# Patient Record
Sex: Female | Born: 1935 | Race: White | Hispanic: No | Marital: Married | Smoking: Never smoker
Health system: Southern US, Community
[De-identification: ages and names within clinical notes are randomized; demographics above are authoritative.]

## PROBLEM LIST (undated history)

## (undated) DIAGNOSIS — Z85038 Personal history of other malignant neoplasm of large intestine: Secondary | ICD-10-CM

## (undated) DIAGNOSIS — E079 Disorder of thyroid, unspecified: Secondary | ICD-10-CM

## (undated) DIAGNOSIS — Z1211 Encounter for screening for malignant neoplasm of colon: Secondary | ICD-10-CM

## (undated) DIAGNOSIS — Z803 Family history of malignant neoplasm of breast: Secondary | ICD-10-CM

## (undated) DIAGNOSIS — M1990 Unspecified osteoarthritis, unspecified site: Secondary | ICD-10-CM

## (undated) DIAGNOSIS — Z800 Family history of malignant neoplasm of digestive organs: Secondary | ICD-10-CM

## (undated) HISTORY — DX: Personal history of other malignant neoplasm of large intestine: Z85.038

## (undated) HISTORY — DX: Unspecified osteoarthritis, unspecified site: M19.90

## (undated) HISTORY — DX: Family history of malignant neoplasm of breast: Z80.3

## (undated) HISTORY — DX: Encounter for screening for malignant neoplasm of colon: Z12.11

## (undated) HISTORY — PX: SALPINGOOPHORECTOMY: SHX82

## (undated) HISTORY — DX: Disorder of thyroid, unspecified: E07.9

## (undated) HISTORY — PX: COLONOSCOPY: SHX174

## (undated) HISTORY — DX: Family history of malignant neoplasm of digestive organs: Z80.0

---

## 1978-11-27 HISTORY — PX: FACIAL NERVE SURGERY: SHX630

## 1981-11-27 HISTORY — PX: ABDOMINAL HYSTERECTOMY: SHX81

## 1988-11-27 HISTORY — PX: COLON SURGERY: SHX602

## 2005-03-30 ENCOUNTER — Ambulatory Visit: Payer: Self-pay | Admitting: General Surgery

## 2005-04-20 ENCOUNTER — Ambulatory Visit: Payer: Self-pay | Admitting: General Surgery

## 2006-05-01 ENCOUNTER — Ambulatory Visit: Payer: Self-pay | Admitting: General Surgery

## 2007-05-02 ENCOUNTER — Ambulatory Visit: Payer: Self-pay | Admitting: General Surgery

## 2008-05-05 ENCOUNTER — Ambulatory Visit: Payer: Self-pay | Admitting: General Surgery

## 2008-07-15 ENCOUNTER — Ambulatory Visit: Payer: Self-pay | Admitting: Ophthalmology

## 2008-07-15 ENCOUNTER — Other Ambulatory Visit: Payer: Self-pay

## 2008-07-27 ENCOUNTER — Ambulatory Visit: Payer: Self-pay | Admitting: Ophthalmology

## 2009-02-02 ENCOUNTER — Ambulatory Visit: Payer: Self-pay | Admitting: Ophthalmology

## 2009-02-15 ENCOUNTER — Ambulatory Visit: Payer: Self-pay | Admitting: Ophthalmology

## 2009-05-27 ENCOUNTER — Ambulatory Visit: Payer: Self-pay | Admitting: General Surgery

## 2009-12-24 ENCOUNTER — Ambulatory Visit: Payer: Self-pay | Admitting: Urology

## 2010-02-23 ENCOUNTER — Ambulatory Visit: Payer: Self-pay | Admitting: Internal Medicine

## 2010-05-31 ENCOUNTER — Ambulatory Visit: Payer: Self-pay | Admitting: General Surgery

## 2010-06-17 ENCOUNTER — Ambulatory Visit: Payer: Self-pay | Admitting: General Surgery

## 2011-06-05 ENCOUNTER — Ambulatory Visit: Payer: Self-pay | Admitting: General Surgery

## 2012-06-05 ENCOUNTER — Ambulatory Visit: Payer: Self-pay | Admitting: General Surgery

## 2012-07-28 HISTORY — PX: EYE SURGERY: SHX253

## 2012-08-07 ENCOUNTER — Ambulatory Visit: Payer: Self-pay

## 2012-08-13 ENCOUNTER — Ambulatory Visit: Payer: Self-pay

## 2013-04-07 ENCOUNTER — Encounter: Payer: Self-pay | Admitting: *Deleted

## 2013-04-07 DIAGNOSIS — Z85038 Personal history of other malignant neoplasm of large intestine: Secondary | ICD-10-CM | POA: Insufficient documentation

## 2013-06-06 ENCOUNTER — Ambulatory Visit: Payer: Self-pay | Admitting: General Surgery

## 2013-06-09 ENCOUNTER — Encounter: Payer: Self-pay | Admitting: General Surgery

## 2013-06-18 ENCOUNTER — Ambulatory Visit: Payer: Self-pay | Admitting: General Surgery

## 2013-06-23 ENCOUNTER — Ambulatory Visit: Payer: Self-pay | Admitting: Family Medicine

## 2013-07-01 ENCOUNTER — Ambulatory Visit: Payer: Self-pay | Admitting: General Surgery

## 2013-07-29 ENCOUNTER — Encounter: Payer: Self-pay | Admitting: *Deleted

## 2013-08-21 ENCOUNTER — Ambulatory Visit (INDEPENDENT_AMBULATORY_CARE_PROVIDER_SITE_OTHER): Payer: Medicare Other | Admitting: General Surgery

## 2013-08-21 ENCOUNTER — Encounter: Payer: Self-pay | Admitting: General Surgery

## 2013-08-21 VITALS — BP 124/70 | HR 70 | Resp 16 | Ht 67.0 in | Wt 127.0 lb

## 2013-08-21 DIAGNOSIS — Z803 Family history of malignant neoplasm of breast: Secondary | ICD-10-CM

## 2013-08-21 DIAGNOSIS — Z85038 Personal history of other malignant neoplasm of large intestine: Secondary | ICD-10-CM

## 2013-08-21 DIAGNOSIS — Z800 Family history of malignant neoplasm of digestive organs: Secondary | ICD-10-CM | POA: Insufficient documentation

## 2013-08-21 NOTE — Patient Instructions (Addendum)
One year bilateral screening mammogram and office visit. Continue self breast exams. Call office for any new breast issues or concerns. Last colonoscopy was 47829- next due in 2016

## 2013-08-21 NOTE — Progress Notes (Signed)
Patient ID: Brittany Oneal, female   DOB: [birth year], 79 y.o.   MRN: [id number]  Chief Complaint  Patient presents with  . Follow-up    mammogram    HPI Brittany Oneal is a 79 y.o. female.  Here today for annual breast evaluation. She is also s/p left colon resection for CA in 1990. Last colonoscopy was in 2011-normal. The most recent mammogram was done on 06-06-13.  Patient does perform regular self breast checks and gets regular mammograms done.  No new breast complaints. No GI complaints.  HPI  Past Medical History  Diagnosis Date  . Unspecified essential hypertension   . Arthritis   . Family history of malignant neoplasm of gastrointestinal tract   . Breast screening, unspecified   . Special screening for malignant neoplasms, colon   . Personal history of malignant neoplasm of large intestine   . Cancer 1990    colon  . Family history of malignant neoplasm of breast   . Thyroid disease     Past Surgical History  Procedure Laterality Date  . Colon surgery  1990    resection d/t cancer  . 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 Abdominal hysterectomy  1983  . Salpingoophorectomy    . Colonoscopy  1997,2001,2004,2006, 2011  . Eye surgery  Sept 2013    Family History  Problem Relation Age of Onset  . Cancer Other     FH of breast and colon cancer  . Cancer Brother     colon  . Cancer Sister     breast    Social History History  Substance Use Topics  . Smoking status: Never Smoker   . Smokeless tobacco: Not on file  . Alcohol Use: No    Allergies  Allergen Reactions  . Contrast Media [Iodinated Diagnostic Agents] Other (See Comments)    Reaction not listed  . Sulfa Antibiotics Hives  . Shellfish Allergy Rash    Current Outpatient Prescriptions  Medication Sig Dispense Refill  . amLODipine (NORVASC) 5 MG tablet Take 5 mg by mouth daily.      . calcium carbonate (OS-CAL - DOSED IN MG OF ELEMENTAL CALCIUM) 1250 MG tablet Take 1  tablet by mouth.      . Glucosamine-Chondroitin (GLUCOSAMINE CHONDR COMPLEX) 500-400 MG CAPS Take 2 capsules by mouth daily.      Marland Kitchen levothyroxine (SYNTHROID, LEVOTHROID) 50 MCG tablet Take 50 mcg by mouth daily before breakfast.      . lisinopril (PRINIVIL,ZESTRIL) 40 MG tablet Take 40 mg by mouth daily.      . Omega-3 Fatty Acids (FISH OIL) 1000 MG CAPS Take 2 capsules by mouth daily.      Marland Kitchen triamterene-hydrochlorothiazide (MAXZIDE-25) 37.5-25 MG per tablet Take 0.5 tablets by mouth daily.      . Vitamin E 100 UNITS TABS Take 1 tablet by mouth daily.       No current facility-administered medications for this visit.    Review of Systems Review of Systems  Constitutional: Negative.   Respiratory: Negative.   Cardiovascular: Negative.     Blood pressure 124/70, pulse 70, resp. rate 16, height 5\' 7"  (1.702 m), weight 127 lb (57.607 kg).  Physical Exam Physical Exam  Constitutional: She is oriented to person, place, and time. She appears well-developed and well-nourished.  Eyes: Conjunctivae are normal. No scleral icterus.  Neck: Neck supple.  Cardiovascular: Normal rate and regular rhythm.   Pulses:  Dorsalis pedis pulses are 2+ on the right side, and 2+ on the left side.       Posterior tibial pulses are 2+ on the right side, and 2+ on the left side.  No lower leg edema  Pulmonary/Chest: Effort normal and breath sounds normal. Right breast exhibits no inverted nipple, no mass, no nipple discharge, no skin change and no tenderness. Left breast exhibits no inverted nipple, no mass, no nipple discharge, no skin change and no tenderness.  Abdominal: Soft. Bowel sounds are normal. There is no hepatosplenomegaly. There is no tenderness. No hernia.  Lymphadenopathy:    She has no cervical adenopathy.    She has no axillary adenopathy.  Neurological: She is alert and oriented to person, place, and time.  Skin: Skin is warm and dry.    Data Reviewed Mammogram reviewed and  stable.  Assessment    Stable exam.    Plan    One year bilateral screening mammogram and office visit.       Refoel Palladino G 08/21/2013, 8:45 PM

## 2013-10-03 ENCOUNTER — Emergency Department: Payer: Self-pay | Admitting: Emergency Medicine

## 2013-10-03 LAB — COMPREHENSIVE METABOLIC PANEL
Albumin: 3.9 g/dL (ref 3.4–5.0)
Alkaline Phosphatase: 66 U/L (ref 50–136)
Anion Gap: 8 (ref 7–16)
BUN: 21 mg/dL — ABNORMAL HIGH (ref 7–18)
Co2: 26 mmol/L (ref 21–32)
Creatinine: 0.89 mg/dL (ref 0.60–1.30)
EGFR (African American): >60
EGFR (Non-African Amer.): >60
Glucose: 128 mg/dL — ABNORMAL HIGH (ref 65–99)
Osmolality: 262 (ref 275–301)
SGOT(AST): 22 U/L (ref 15–37)
SGPT (ALT): 18 U/L (ref 12–78)
Sodium: 128 mmol/L — ABNORMAL LOW (ref 136–145)

## 2013-10-03 LAB — CBC
MCHC: 36.1 g/dL — ABNORMAL HIGH (ref 32.0–36.0)
Platelet: 233 10*3/uL (ref 150–440)
RBC: 4.22 10*6/uL (ref 3.80–5.20)
WBC: 7.6 10*3/uL (ref 3.6–11.0)

## 2013-10-03 LAB — URINALYSIS, COMPLETE
Bacteria: NONE SEEN
Bilirubin,UR: NEGATIVE
Glucose,UR: NEGATIVE mg/dL (ref 0–75)
Hyaline Cast: 9
Leukocyte Esterase: NEGATIVE
Ph: 8 (ref 4.5–8.0)
Specific Gravity: 1.014 (ref 1.003–1.030)
WBC UR: <1 /HPF (ref 0–5)

## 2014-02-03 ENCOUNTER — Ambulatory Visit: Payer: Self-pay | Admitting: Family Medicine

## 2014-08-18 ENCOUNTER — Encounter: Payer: Self-pay | Admitting: General Surgery

## 2014-08-18 ENCOUNTER — Ambulatory Visit (INDEPENDENT_AMBULATORY_CARE_PROVIDER_SITE_OTHER): Payer: Medicare Other | Admitting: General Surgery

## 2014-08-18 VITALS — BP 150/74 | HR 74 | Resp 12 | Ht 66.0 in | Wt 124.0 lb

## 2014-08-18 DIAGNOSIS — Z803 Family history of malignant neoplasm of breast: Secondary | ICD-10-CM

## 2014-08-18 DIAGNOSIS — Z85038 Personal history of other malignant neoplasm of large intestine: Secondary | ICD-10-CM

## 2014-08-18 DIAGNOSIS — Z800 Family history of malignant neoplasm of digestive organs: Secondary | ICD-10-CM

## 2014-08-18 NOTE — Patient Instructions (Signed)
Follow up in one year with bilateral screening mammogram and office visit. Continue self breast exams. Call office for any new breast issues or concerns. 

## 2014-08-18 NOTE — Progress Notes (Signed)
Patient ID: Brittany Oneal, female   DOB: 19-Aug-1931, 80 y.o.   MRN: [id number]  Chief Complaint  Patient presents with  . Follow-up    mammogram    HPI Brittany Oneal is a 80 y.o. female who presents for a breast evaluation. She had sigmoid resection for colon cancer in 1990.The most recent mammogram was done on 08/14/14.    Patient does perform regular self breast checks and gets regular mammograms done.  No new breast issues.   HPI  Past Medical History  Diagnosis Date  . Unspecified essential hypertension   . Arthritis   . Family history of malignant neoplasm of gastrointestinal tract   . Breast screening, unspecified   . Special screening for malignant neoplasms, colon   . Personal history of malignant neoplasm of large intestine   . Cancer 1990    colon  . Family history of malignant neoplasm of breast   . Thyroid disease     Past Surgical History  Procedure Laterality Date  . Colon surgery  1990    resection d/t cancer  . 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 Abdominal hysterectomy  1983  . Salpingoophorectomy    . Colonoscopy  1997,2001,2004,2006, 2011  . Eye surgery  Sept 2013    Family History  Problem Relation Age of Onset  . Cancer Other     FH of breast and colon cancer  . Cancer Brother     colon  . Cancer Sister     breast    Social History History  Substance Use Topics  . Smoking status: Never Smoker   . Smokeless tobacco: Not on file  . Alcohol Use: No    Allergies  Allergen Reactions  . Contrast Media [Iodinated Diagnostic Agents] Other (See Comments)    Reaction not listed  . Sulfa Antibiotics Hives  . Shellfish Allergy Rash    Current Outpatient Prescriptions  Medication Sig Dispense Refill  . amLODipine (NORVASC) 5 MG tablet Take 5 mg by mouth daily.      . calcium carbonate (OS-CAL - DOSED IN MG OF ELEMENTAL CALCIUM) 1250 MG tablet Take 1 tablet by mouth.      . Glucosamine-Chondroitin (GLUCOSAMINE  CHONDR COMPLEX) 500-400 MG CAPS Take 2 capsules by mouth daily.      Marland Kitchen levothyroxine (SYNTHROID, LEVOTHROID) 50 MCG tablet Take 50 mcg by mouth daily before breakfast.      . lisinopril (PRINIVIL,ZESTRIL) 40 MG tablet Take 40 mg by mouth daily.      . Omega-3 Fatty Acids (FISH OIL) 1000 MG CAPS Take 2 capsules by mouth daily.      . Vitamin E 100 UNITS TABS Take 1 tablet by mouth daily.       No current facility-administered medications for this visit.    Review of Systems Review of Systems  Constitutional: Negative.   Respiratory: Negative.   Cardiovascular: Negative.     Blood pressure 150/74, pulse 74, resp. rate 12, height 5\' 6"  (1.676 m), weight 124 lb (56.246 kg).  Physical Exam Physical Exam  Constitutional: She is oriented to person, place, and time. She appears well-developed and well-nourished.  Eyes: Conjunctivae are normal. No scleral icterus.  Neck: Neck supple.  Cardiovascular: Normal rate, regular rhythm and normal heart sounds.   Pulses:      Dorsalis pedis pulses are 2+ on the right side, and 2+ on the left side.       Posterior tibial pulses  are 2+ on the right side, and 2+ on the left side.  Mild bilateral ankle edema. No varicose veins.  Pulmonary/Chest: Effort normal and breath sounds normal. Right breast exhibits no inverted nipple, no mass, no nipple discharge, no skin change and no tenderness. Left breast exhibits no inverted nipple, no mass, no nipple discharge, no skin change and no tenderness.  Abdominal: Soft. Normal appearance and bowel sounds are normal. There is no hepatosplenomegaly. No hernia.  Lymphadenopathy:    She has no cervical adenopathy.    She has no axillary adenopathy.  Neurological: She is alert and oriented to person, place, and time.  Skin: Skin is warm and dry.    Data Reviewed Mammogram reviewed and stable.  Assessment    Stable physical exam.    Plan    Follow up in one year with bilateral screening mammogram and office  visit. Colonoscopy due in 2018       [facility] G 08/18/2014, 9:26 PM

## 2014-08-25 ENCOUNTER — Ambulatory Visit: Payer: Medicare Other | Admitting: General Surgery

## 2014-09-28 ENCOUNTER — Encounter: Payer: Self-pay | Admitting: General Surgery

## 2014-12-21 DIAGNOSIS — C801 Malignant (primary) neoplasm, unspecified: Secondary | ICD-10-CM | POA: Insufficient documentation

## 2015-03-09 ENCOUNTER — Emergency Department
Admit: 2015-03-09 | Disposition: A | Discharge status: Home / Self Care | Payer: Self-pay | Admitting: Emergency Medicine

## 2015-03-09 LAB — URINALYSIS, COMPLETE
Bilirubin,UR: NEGATIVE
Blood: NEGATIVE
Glucose,UR: NEGATIVE mg/dL (ref 0–75)
KETONE: NEGATIVE
Leukocyte Esterase: NEGATIVE
Nitrite: NEGATIVE
Ph: 8 (ref 4.5–8.0)
Protein: NEGATIVE
Specific Gravity: 1.005 (ref 1.003–1.030)

## 2015-03-09 LAB — CBC
HCT: 37.8 % (ref 35.0–47.0)
HGB: 12.6 g/dL (ref 12.0–16.0)
MCH: 28.7 pg (ref 26.0–34.0)
MCHC: 33.3 g/dL (ref 32.0–36.0)
MCV: 86 fL (ref 80–100)
PLATELETS: 190 10*3/uL (ref 150–440)
RBC: 4.39 10*6/uL (ref 3.80–5.20)
RDW: 13.6 % (ref 11.5–14.5)
WBC: 5.8 10*3/uL (ref 3.6–11.0)

## 2015-03-09 LAB — COMPREHENSIVE METABOLIC PANEL
ALBUMIN: 4.4 g/dL
ALT: 17 U/L
AST: 24 U/L
Alkaline Phosphatase: 54 U/L
Anion Gap: 10 (ref 7–16)
BUN: 30 mg/dL — AB
Bilirubin,Total: 0.5 mg/dL
CALCIUM: 9.1 mg/dL
Chloride: 96 mmol/L — ABNORMAL LOW
Co2: 26 mmol/L
Creatinine: 0.75 mg/dL
EGFR (African American): >60
EGFR (Non-African Amer.): >60
GLUCOSE: 121 mg/dL — AB
Potassium: 4.5 mmol/L
SODIUM: 132 mmol/L — AB
Total Protein: 6.7 g/dL

## 2015-03-09 LAB — TROPONIN I

## 2015-05-06 ENCOUNTER — Telehealth: Payer: Self-pay | Admitting: Family Medicine

## 2015-05-06 NOTE — Telephone Encounter (Signed)
Advised to move appt to sooner date as we have not addressed this spot on head.[facility]

## 2015-05-06 NOTE — Telephone Encounter (Signed)
Pt has appt on 6/17.  Should she come in sooner because the spot on the back of her neck/head is sore. Please call pt back at [phone]

## 2015-05-07 ENCOUNTER — Ambulatory Visit (INDEPENDENT_AMBULATORY_CARE_PROVIDER_SITE_OTHER): Payer: Medicare Other | Admitting: Family Medicine

## 2015-05-07 ENCOUNTER — Encounter: Payer: Self-pay | Admitting: Family Medicine

## 2015-05-07 VITALS — BP 160/80 | HR 71 | Temp 97.6°F | Wt 122.4 lb

## 2015-05-07 DIAGNOSIS — I10 Essential (primary) hypertension: Secondary | ICD-10-CM

## 2015-05-07 DIAGNOSIS — L723 Sebaceous cyst: Secondary | ICD-10-CM

## 2015-05-07 DIAGNOSIS — E039 Hypothyroidism, unspecified: Secondary | ICD-10-CM | POA: Diagnosis not present

## 2015-05-07 DIAGNOSIS — L089 Local infection of the skin and subcutaneous tissue, unspecified: Secondary | ICD-10-CM

## 2015-05-07 MED ORDER — CEPHALEXIN 500 MG PO CAPS: 500.0000 mg | ORAL_CAPSULE | Freq: Three times a day (TID) | ORAL | Status: AC

## 2015-05-07 NOTE — Patient Instructions (Signed)
Appt. With Dr. Jamal Collin already made for [facility]. (05/10/15) at 3:00 PM.

## 2015-05-07 NOTE — Progress Notes (Signed)
Name: Brittany Oneal   MRN: [id number]    DOB: 1931/05/30   Date:05/07/2015       Progress Note  Subjective  Chief Complaint  Chief Complaint  Patient presents with  . Hypertension follow up  . Knot on back of head    cyst    HPI   Here for f/u of HBP.   BPs at home usually in 120 sys. Range.  Have been as low as 97 sys. Has a "knot" on back of her head that she wants looked at today.  Has been there for years, but getting bigger and sorer recently.  Has seen Derm at [facility] in [place].  Past Medical History  Diagnosis Date  . Unspecified essential hypertension   . Arthritis   . Family history of malignant neoplasm of gastrointestinal tract   . Breast screening, unspecified   . Special screening for malignant neoplasms, colon   . Personal history of malignant neoplasm of large intestine   . Cancer 1990    colon  . Family history of malignant neoplasm of breast   . Thyroid disease     Past Surgical History  Procedure Laterality Date  . Colon surgery  1990    resection d/t cancer  . 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 Abdominal hysterectomy  1983  . Salpingoophorectomy    . Colonoscopy  1997,2001,2004,2006, 2011  . Eye surgery  Sept 2013    Family History  Problem Relation Age of Onset  . Cancer Other     FH of breast and colon cancer  . Cancer Brother     colon  . Cancer Sister     breast    History   Social History  . Marital Status: Married    Spouse Name: N/A  . Number of Children: N/A  . Years of Education: N/A   Occupational History  . Not on file.   Social History Main Topics  . Smoking status: Never Smoker   . Smokeless tobacco: Not on file  . Alcohol Use: No  . Drug Use: No  . Sexual Activity: Not on file   Other Topics Concern  . Not on file   Social History Narrative     Current outpatient prescriptions:  .  amLODipine (NORVASC) 5 MG tablet, Take 5 mg by mouth daily., Disp: , Rfl:  .  calcium carbonate  (OS-CAL - DOSED IN MG OF ELEMENTAL CALCIUM) 1250 MG tablet, Take 1 tablet by mouth., Disp: , Rfl:  .  Glucosamine-Chondroitin (GLUCOSAMINE CHONDR COMPLEX) 500-400 MG CAPS, Take 2 capsules by mouth daily., Disp: , Rfl:  .  levothyroxine (SYNTHROID, LEVOTHROID) 50 MCG tablet, Take 50 mcg by mouth daily before breakfast., Disp: , Rfl:  .  lisinopril (PRINIVIL,ZESTRIL) 40 MG tablet, Take 40 mg by mouth daily., Disp: , Rfl:  .  Omega-3 Fatty Acids (FISH OIL) 1000 MG CAPS, Take 2 capsules by mouth daily., Disp: , Rfl:  .  Vitamin E 100 UNITS TABS, Take 1 tablet by mouth daily., Disp: , Rfl:  .  cephALEXin (KEFLEX) 500 MG capsule, Take 1 capsule (500 mg total) by mouth 3 (three) times daily., Disp: 21 capsule, Rfl: 0  Allergies  Allergen Reactions  . Contrast Media [Iodinated Diagnostic Agents] Other (See Comments)    Reaction not listed  . Sulfa Antibiotics Hives  . Shellfish Allergy Rash     Review of Systems  Constitutional: Negative.  Negative for fever,  chills and malaise/fatigue.  HENT: Negative.   Eyes: Negative.  Negative for blurred vision.  Respiratory: Negative.  Negative for cough, shortness of breath and wheezing.   Cardiovascular: Negative.  Negative for chest pain, palpitations, orthopnea and leg swelling.  Gastrointestinal: Negative.  Negative for heartburn, nausea, vomiting, abdominal pain, diarrhea and constipation.  Skin:       Mass on back of scalp  Neurological: Negative.  Negative for dizziness, tingling, sensory change, focal weakness, seizures, loss of consciousness, weakness and headaches.     Objective  Filed Vitals:   05/07/15 1101 05/07/15 1132  BP: 173/83 160/80  Pulse: 71   Temp: 97.6 F (36.4 C)   TempSrc: Oral   Weight: 122 lb 6.4 oz (55.52 kg)     Physical Exam  Constitutional: She is well-developed, well-nourished, and in no distress. No distress.  HENT:  Head: Normocephalic and atraumatic.  Neck: Normal range of motion. Neck supple. No  thyromegaly present.  Cardiovascular: Normal rate, regular rhythm, normal heart sounds and intact distal pulses.  Exam reveals no gallop and no friction rub.   No murmur heard. Pulmonary/Chest: Effort normal and breath sounds normal. No respiratory distress. She has no wheezes. She has no rales. She exhibits no tenderness.  Musculoskeletal: She exhibits edema (trace edema of ankles billaterally).  Lymphadenopathy:    She has no cervical adenopathy.  Skin:  2 cm raised cyst of post. Scalp.  It is sl. Red, warm, and tender to palp.  Vitals reviewed.      Recent Results (from the past 2160 hour(s))  Troponin I     Status: None   Collection Time: 03/09/15  4:12 PM  Result Value Ref Range   Troponin-I <0.03 ng/mL    Comment: 0.00-0.03 0.03 ng/mL or less: NEGATIVE  Repeat testing in 3-6 hrs  if clinically indicated. >0.05 ng/mL: POTENTIAL  MYOCARDIAL INJURY. Repeat  testing in 3-6 hrs if  clinically indicated. NOTE: An increase or decrease  of 30% or more on serial  testing suggests a  clinically important change NOTE: New Reference Range  02/02/15   CBC     Status: None   Collection Time: 03/09/15  4:12 PM  Result Value Ref Range   WBC 5.8 3.6-11.0 x10 3/mm 3   RBC 4.39 3.80-5.20 X10 6/mm 3   HGB 12.6 12.0-16.0 g/dL   HCT 37.8 35.0-47.0 %   MCV 86 80-100 fL   MCH 28.7 26.0-34.0 pg   MCHC 33.3 32.0-36.0 g/dL   RDW 13.6 11.5-14.5 %   Platelet 190 150-440 x10 3/mm 3  Comprehensive metabolic panel     Status: Abnormal   Collection Time: 03/09/15  4:12 PM  Result Value Ref Range   Glucose, CSF DNP mg/dL    Comment: 65-99 NOTE: New Reference Range  02/02/15    BUN 30 (H) mg/dL    Comment: 6-20 NOTE: New Reference Range  02/02/15    Creatinine 0.75 mg/dL    Comment: 0.44-1.00 NOTE: New Reference Range  02/02/15    Sodium, Urine Random DNP mmol/L    Comment: 135-145 NOTE: New Reference Range  02/02/15    Potassium, Urine Random DNP mmol/L    Comment:  3.5-5.1 NOTE: New Reference Range  02/02/15    Chloride, Urine Random DNP mmol/L    Comment: 101-111 NOTE: New Reference Range  02/02/15    Co2 26 mmol/L    Comment: 22-32 NOTE: New Reference Range  02/02/15    Calcium, Total 9.1 mg/dL  Comment: 8.9-10.3 NOTE: New Reference Range  02/02/15    SGOT(AST) 24 U/L    Comment: 15-41 NOTE: New Reference Range  02/02/15    SGPT (ALT) 17 U/L    Comment: 14-54 NOTE: New Reference Range  02/02/15    Alkaline Phosphatase 54 U/L    Comment: 38-126 NOTE: New Reference Range  02/02/15    Albumin 4.4 g/dL    Comment: 3.5-5.0 NOTE: New reference range  02/02/15    Total Protein 6.7 g/dL    Comment: 6.5-8.1 NOTE: New Reference Range  02/02/15    Bilirubin,Total 0.5 mg/dL    Comment: 0.3-1.2 NOTE: New Reference Range  02/02/15    Anion Gap 10 7-16   EGFR (African American) >60    EGFR (Non-African Amer.) >60     Comment: eGFR values <84m/min/1.73 m2 may be an indication of chronic kidney disease (CKD). Calculated eGFR is useful in patients with stable renal function. The eGFR calculation will not be reliable in acutely ill patients when serum creatinine is changing rapidly. It is not useful in patients on dialysis. The eGFR calculation may not be applicable to patients at the low and high extremes of body sizes, pregnant women, and vegetarians.    Glucose 121 (H) mg/dL    Comment: 65-99 NOTE: New Reference Range  02/02/15    Sodium 132 (L) mmol/L    Comment: 135-145 NOTE: New Reference Range  02/02/15    Potassium 4.5 mmol/L    Comment: 3.5-5.1 NOTE: New Reference Range  02/02/15    Chloride 96 (L) mmol/L    Comment: 101-111 NOTE: New Reference Range  02/02/15   Urinalysis, Complete     Status: None   Collection Time: 03/09/15  5:11 PM  Result Value Ref Range   Color - urine YELLOW    Clarity - urine CLEAR    Glucose,UR NEGATIVE 0-75 mg/dL   Bilirubin,UR NEGATIVE NEGATIVE   Ketone NEGATIVE  NEGATIVE   Specific Gravity 1.005 1.003-1.030   Blood NEGATIVE NEGATIVE   Ph 8.0 4.5-8.0   Protein NEGATIVE NEGATIVE   Nitrite NEGATIVE NEGATIVE   Leukocyte Esterase NEGATIVE NEGATIVE   RBC,UR 0-5 0-5 /HPF   WBC UR 0-5 0-5 /HPF   Bacteria RARE NONE SEEN   Squamous Epithelial 0-5      Assessment & Plan  Problem List Items Addressed This Visit      Cardiovascular and Mediastinum   Hypertension - Primary (Chronic)   Relevant Orders   Ambulatory referral to Cardiology     Endocrine   Hypothyroidism (Chronic)     Musculoskeletal and Integument   Infected sebaceous cyst   Relevant Medications   cephALEXin (KEFLEX) 500 MG capsule   Other Relevant Orders   Ambulatory referral to General Surgery     1. Essential hypertension  - Ambulatory referral to Cardiology  2. Hypothyroidism, unspecified hypothyroidism type   3. Infected sebaceous cyst  - Ambulatory referral to General Surgery - cephALEXin (KEFLEX) 500 MG capsule; Take 1 capsule (500 mg total) by mouth 3 (three) times daily.  Dispense: 21 capsule; Refill: 0

## 2015-05-07 NOTE — Telephone Encounter (Signed)
Patient seeing Dr. Luan Pulling this morning. Nothing further needed.

## 2015-05-10 ENCOUNTER — Ambulatory Visit (INDEPENDENT_AMBULATORY_CARE_PROVIDER_SITE_OTHER): Payer: Medicare Other | Admitting: General Surgery

## 2015-05-10 ENCOUNTER — Encounter: Payer: Self-pay | Admitting: General Surgery

## 2015-05-10 VITALS — BP 138/72 | HR 78 | Resp 14 | Ht 66.0 in | Wt 123.0 lb

## 2015-05-10 DIAGNOSIS — L729 Follicular cyst of the skin and subcutaneous tissue, unspecified: Secondary | ICD-10-CM

## 2015-05-10 NOTE — Progress Notes (Signed)
Patient ID: Brittany Oneal, female   DOB: [birth year], 81 y.o.   MRN: [id number]  Chief Complaint  Patient presents with  . Other    scalp cyst    HPI Brittany Oneal is a 81 y.o. female for evaluation of a scalp cyst. She was seen for this in January 2015 by dermatology and at that time it was quite small. It has increased in size and become soar to touch in the last week. She denies any drainage from the area. She has been started on 7 day course of Keflex for this.  HPI  Past Medical History  Diagnosis Date  . Unspecified essential hypertension   . Arthritis   . Family history of malignant neoplasm of gastrointestinal tract   . Breast screening, unspecified   . Special screening for malignant neoplasms, colon   . Personal history of malignant neoplasm of large intestine   . Cancer 1990    colon  . Family history of malignant neoplasm of breast   . Thyroid disease     Past Surgical History  Procedure Laterality Date  . Colon surgery  1990    resection d/t cancer  . 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 Abdominal hysterectomy  1983  . Salpingoophorectomy    . Colonoscopy  1997,2001,2004,2006, 2011  . Eye surgery  Sept 2013    Family History  Problem Relation Age of Onset  . Cancer Other     FH of breast and colon cancer  . Cancer Brother     colon  . Cancer Sister     breast    Social History History  Substance Use Topics  . Smoking status: Never Smoker   . Smokeless tobacco: Never Used  . Alcohol Use: No    Allergies  Allergen Reactions  . Contrast Media [Iodinated Diagnostic Agents] Other (See Comments)    Reaction not listed  . Sulfa Antibiotics Hives  . Shellfish Allergy Rash    Current Outpatient Prescriptions  Medication Sig Dispense Refill  . amLODipine (NORVASC) 5 MG tablet Take 5 mg by mouth daily.    . calcium carbonate (OS-CAL - DOSED IN MG OF ELEMENTAL CALCIUM) 1250 MG tablet Take 1 tablet by mouth.    . cephALEXin (KEFLEX)  500 MG capsule Take 1 capsule (500 mg total) by mouth 3 (three) times daily. 21 capsule 0  . Glucosamine-Chondroitin (GLUCOSAMINE CHONDR COMPLEX) 500-400 MG CAPS Take 2 capsules by mouth daily.    Marland Kitchen levothyroxine (SYNTHROID, LEVOTHROID) 50 MCG tablet Take 50 mcg by mouth daily before breakfast.    . lisinopril (PRINIVIL,ZESTRIL) 40 MG tablet Take 40 mg by mouth daily.    . Omega-3 Fatty Acids (FISH OIL) 1000 MG CAPS Take 2 capsules by mouth daily.    . Vitamin E 100 UNITS TABS Take 1 tablet by mouth daily.    . fluticasone (FLONASE) 50 MCG/ACT nasal spray Place 1 spray into the nose as needed.     No current facility-administered medications for this visit.    Review of Systems Review of Systems  Constitutional: Negative.   Respiratory: Negative.   Cardiovascular: Negative.     Blood pressure 138/72, pulse 78, resp. rate 14, height 5\' 6"  (1.676 m), weight 123 lb (55.792 kg).  Physical Exam Physical Exam  Constitutional: She is oriented to person, place, and time. She appears well-developed and well-nourished.  HENT:  1.5 cm cyst to back of head.   Neurological: She  is alert and oriented to person, place, and time.  Skin: Skin is warm and dry.       Data Reviewed   Assessment    Scalp cyst.  Mildly inflammed    Plan    Finish antibiotic-Keflex and return to the office next week for excision.      PCP: Dr Michel Bickers, Caryl-Lyn M 05/10/2015, 3:09 PM

## 2015-05-10 NOTE — Patient Instructions (Signed)
Follow up next week for excision of cyst. Continue antibiotic.

## 2015-05-11 ENCOUNTER — Telehealth: Payer: Self-pay

## 2015-05-11 NOTE — Telephone Encounter (Signed)
Pt has been schedule appt for Cardiology Dr.Arida at [facility] on July 29,2016 @2 :40pm. Please call patient and make her aware.

## 2015-05-11 NOTE — Telephone Encounter (Signed)
This encounter was created in error - please disregard.

## 2015-05-14 ENCOUNTER — Ambulatory Visit: Payer: Self-pay | Admitting: Family Medicine

## 2015-05-18 ENCOUNTER — Encounter: Payer: Self-pay | Admitting: General Surgery

## 2015-05-18 ENCOUNTER — Ambulatory Visit (INDEPENDENT_AMBULATORY_CARE_PROVIDER_SITE_OTHER): Payer: Medicare Other | Admitting: General Surgery

## 2015-05-18 VITALS — BP 124/76 | HR 70 | Resp 12 | Ht 66.0 in | Wt 123.0 lb

## 2015-05-18 DIAGNOSIS — L089 Local infection of the skin and subcutaneous tissue, unspecified: Secondary | ICD-10-CM

## 2015-05-18 DIAGNOSIS — L729 Follicular cyst of the skin and subcutaneous tissue, unspecified: Secondary | ICD-10-CM | POA: Diagnosis not present

## 2015-05-18 NOTE — Patient Instructions (Signed)
Patient to return in four weeks.  Patient advised to call for any recurrent swelling or increased pain.

## 2015-05-18 NOTE — Progress Notes (Signed)
Patient ID: Brittany Oneal, female   DOB: 04-17-1931, 81 y.o.   MRN: [id number]  Chief Complaint  Patient presents with  . Procedure    excision scalp lesion    HPI Brittany Oneal is a 81 y.o. female here today for a scalp excision. Patient came in today with a lot of drainage. The cyst started draining yesterday.  HPI  Past Medical History  Diagnosis Date  . Unspecified essential hypertension   . Arthritis   . Family history of malignant neoplasm of gastrointestinal tract   . Breast screening, unspecified   . Special screening for malignant neoplasms, colon   . Personal history of malignant neoplasm of large intestine   . Cancer 1990    colon  . Family history of malignant neoplasm of breast   . Thyroid disease     Past Surgical History  Procedure Laterality Date  . Colon surgery  1990    resection d/t cancer  . 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 Abdominal hysterectomy  1983  . Salpingoophorectomy    . Colonoscopy  1997,2001,2004,2006, 2011  . Eye surgery  Sept 2013    Family History  Problem Relation Age of Onset  . Cancer Other     FH of breast and colon cancer  . Cancer Brother     colon  . Cancer Sister     breast    Social History History  Substance Use Topics  . Smoking status: Never Smoker   . Smokeless tobacco: Never Used  . Alcohol Use: No    Allergies  Allergen Reactions  . Contrast Media [Iodinated Diagnostic Agents] Other (See Comments)    Reaction not listed  . Sulfa Antibiotics Hives  . Shellfish Allergy Rash    Current Outpatient Prescriptions  Medication Sig Dispense Refill  . amLODipine (NORVASC) 5 MG tablet Take 5 mg by mouth daily.    . calcium carbonate (OS-CAL - DOSED IN MG OF ELEMENTAL CALCIUM) 1250 MG tablet Take 1 tablet by mouth.    . fluticasone (FLONASE) 50 MCG/ACT nasal spray Place 1 spray into the nose as needed.    . Glucosamine-Chondroitin (GLUCOSAMINE CHONDR COMPLEX) 500-400 MG CAPS Take 2 capsules  by mouth daily.    Marland Kitchen levothyroxine (SYNTHROID, LEVOTHROID) 50 MCG tablet Take 50 mcg by mouth daily before breakfast.    . lisinopril (PRINIVIL,ZESTRIL) 40 MG tablet Take 40 mg by mouth daily.    . Omega-3 Fatty Acids (FISH OIL) 1000 MG CAPS Take 2 capsules by mouth daily.    . Vitamin E 100 UNITS TABS Take 1 tablet by mouth daily.     No current facility-administered medications for this visit.    Review of Systems Review of Systems  Blood pressure 124/76, pulse 70, resp. rate 12, height 5\' 6"  (1.676 m), weight 123 lb (55.792 kg).  Physical Exam Physical Exam The cyst on the left occipital area is draining some pus. With pressure, 1-2 mL of thick, yellow pus was removed. Given the infection, excision was not performed today.   Data Reviewed   Assessment    Infected cyst of the scalp.      Plan    Patient to return in four weeks.  Patient advised to call for any recurrent swelling or increased pain.  PCP: Curly Rim G 05/18/2015, 1:46 PM

## 2015-06-16 ENCOUNTER — Other Ambulatory Visit: Payer: Self-pay

## 2015-06-16 ENCOUNTER — Ambulatory Visit: Payer: Medicare Other | Admitting: General Surgery

## 2015-06-16 DIAGNOSIS — Z1231 Encounter for screening mammogram for malignant neoplasm of breast: Secondary | ICD-10-CM

## 2015-06-22 ENCOUNTER — Encounter: Payer: Self-pay | Admitting: General Surgery

## 2015-06-22 ENCOUNTER — Ambulatory Visit (INDEPENDENT_AMBULATORY_CARE_PROVIDER_SITE_OTHER): Payer: Medicare Other | Admitting: General Surgery

## 2015-06-22 VITALS — BP 120/66 | HR 82 | Resp 14 | Ht 66.0 in | Wt 122.0 lb

## 2015-06-22 DIAGNOSIS — L089 Local infection of the skin and subcutaneous tissue, unspecified: Secondary | ICD-10-CM

## 2015-06-22 DIAGNOSIS — L729 Follicular cyst of the skin and subcutaneous tissue, unspecified: Secondary | ICD-10-CM

## 2015-06-22 NOTE — Progress Notes (Signed)
Patient ID: Brittany Oneal, female   DOB: Jan 07, 1931, 81 y.o.   MRN: [id number]  Chief Complaint  Patient presents with  . Follow-up    scalp cyst    HPI Brittany Oneal is a 81 y.o. female here today following up from a scalp cyst.  Patient states the area is doing well.  HPI  Past Medical History  Diagnosis Date  . Unspecified essential hypertension   . Arthritis   . Family history of malignant neoplasm of gastrointestinal tract   . Breast screening, unspecified   . Special screening for malignant neoplasms, colon   . Personal history of malignant neoplasm of large intestine   . Cancer 1990    colon  . Family history of malignant neoplasm of breast   . Thyroid disease     Past Surgical History  Procedure Laterality Date  . Colon surgery  1990    resection d/t cancer  . 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 Abdominal hysterectomy  1983  . Salpingoophorectomy    . Colonoscopy  1997,2001,2004,2006, 2011  . Eye surgery  Sept 2013    Family History  Problem Relation Age of Onset  . Cancer Other     FH of breast and colon cancer  . Cancer Brother     colon  . Cancer Sister     breast    Social History History  Substance Use Topics  . Smoking status: Never Smoker   . Smokeless tobacco: Never Used  . Alcohol Use: No    Allergies  Allergen Reactions  . Contrast Media [Iodinated Diagnostic Agents] Other (See Comments)    Reaction not listed  . Sulfa Antibiotics Hives  . Shellfish Allergy Rash    Current Outpatient Prescriptions  Medication Sig Dispense Refill  . amLODipine (NORVASC) 5 MG tablet Take 5 mg by mouth daily.    . calcium carbonate (OS-CAL - DOSED IN MG OF ELEMENTAL CALCIUM) 1250 MG tablet Take 1 tablet by mouth.    . fluticasone (FLONASE) 50 MCG/ACT nasal spray Place 1 spray into the nose as needed.    . Glucosamine-Chondroitin (GLUCOSAMINE CHONDR COMPLEX) 500-400 MG CAPS Take 2 capsules by mouth daily.    Marland Kitchen levothyroxine  (SYNTHROID, LEVOTHROID) 50 MCG tablet Take 50 mcg by mouth daily before breakfast.    . lisinopril (PRINIVIL,ZESTRIL) 40 MG tablet Take 40 mg by mouth daily.    . Omega-3 Fatty Acids (FISH OIL) 1000 MG CAPS Take 2 capsules by mouth daily.    . Vitamin E 100 UNITS TABS Take 1 tablet by mouth daily.     No current facility-administered medications for this visit.    Review of Systems Review of Systems  Constitutional: Negative.   Respiratory: Negative.   Cardiovascular: Negative.     Blood pressure 120/66, pulse 82, resp. rate 14, height 5\' 6"  (1.676 m), weight 122 lb (55.339 kg).  Physical Exam Physical Exam  Constitutional: She is oriented to person, place, and time. She appears well-developed and well-nourished.  Neurological: She is alert and oriented to person, place, and time.  Skin: Skin is warm and dry.  Over the left occiptial region the previous 2 cm infected cyst is no longer palpable. There is only a 5 mm subtle surface swelling of the skin on this site. Likely this area may resolve fully.   Data Reviewed None  Assessment    Scalp cyst   Plan    Patient to return  2-3 weeks. If there is still a visible or palpable cyst, will plan to excise.         Chonita Gadea G 06/22/2015, 12:38 PM

## 2015-06-22 NOTE — Patient Instructions (Addendum)
Patient to return in 2-3 weeks.

## 2015-06-25 ENCOUNTER — Encounter: Payer: Self-pay | Admitting: Cardiovascular Disease

## 2015-06-25 ENCOUNTER — Ambulatory Visit (INDEPENDENT_AMBULATORY_CARE_PROVIDER_SITE_OTHER): Payer: Medicare Other | Admitting: Cardiovascular Disease

## 2015-06-25 VITALS — BP 142/72 | HR 79 | Ht 66.0 in | Wt 121.5 lb

## 2015-06-25 DIAGNOSIS — I10 Essential (primary) hypertension: Secondary | ICD-10-CM

## 2015-06-25 NOTE — Progress Notes (Signed)
Patient ID: Brittany Oneal, female    DOB: 1930-12-15, 81 y.o.   MRN: [id number]  HPI Comments: Brittany Oneal is a very pleasant 81 year old woman with prior history of lightheadedness, syncope 2 years ago, possibly again one year ago, who presents for evaluation of her blood pressure/hypertension  She reports that blood pressure is labile at times. 3 months ago was very low with 90 systolic. Seen by Dr. Luan Pulling several months ago and systolic pressure was 527. Blood pressure at home typically 120s Seen by Dr. Jamal Collin and systolic pressure typically 120 up to 130s  Overall she feels well with no complaints. She takes amlodipine 2.5 mg daily, lisinopril 40 mg daily EKG on today's visit shows normal sinus rhythm with rate 79 bpm, no significant ST or T-wave changes  No prior smoking history, no diabetes   Allergies  Allergen Reactions  . Contrast Media [Iodinated Diagnostic Agents] Other (See Comments)    Reaction not listed  . Sulfa Antibiotics Hives  . Shellfish Allergy Rash    Current Outpatient Prescriptions on File Prior to Visit  Medication Sig Dispense Refill  . amLODipine (NORVASC) 5 MG tablet Take 2.5 mg by mouth daily.     . calcium carbonate (OS-CAL - DOSED IN MG OF ELEMENTAL CALCIUM) 1250 MG tablet Take 1 tablet by mouth.    . fluticasone (FLONASE) 50 MCG/ACT nasal spray Place 1 spray into the nose as needed.    . Glucosamine-Chondroitin (GLUCOSAMINE CHONDR COMPLEX) 500-400 MG CAPS Take 2 capsules by mouth daily.    Marland Kitchen levothyroxine (SYNTHROID, LEVOTHROID) 50 MCG tablet Take 50 mcg by mouth daily before breakfast.    . lisinopril (PRINIVIL,ZESTRIL) 40 MG tablet Take 40 mg by mouth daily.    . Omega-3 Fatty Acids (FISH OIL) 1000 MG CAPS Take 2 capsules by mouth daily.    . Vitamin E 100 UNITS TABS Take 1 tablet by mouth daily.     No current facility-administered medications on file prior to visit.    Past Medical History  Diagnosis Date  . Unspecified essential  hypertension   . Arthritis   . Family history of malignant neoplasm of gastrointestinal tract   . Breast screening, unspecified   . Special screening for malignant neoplasms, colon   . Personal history of malignant neoplasm of large intestine   . Cancer 1990    colon  . Family history of malignant neoplasm of breast   . Thyroid disease     Past Surgical History  Procedure Laterality Date  . Colon surgery  1990    resection d/t cancer  . 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 Abdominal hysterectomy  1983  . Salpingoophorectomy    . Colonoscopy  1997,2001,2004,2006, 2011  . Eye surgery  Sept 2013    Social History  reports that she has never smoked. She has never used smokeless tobacco. She reports that she does not drink alcohol or use illicit drugs.  Family History family history includes Cancer in her brother, other, and sister; Hypertension in her father and mother.   Review of Systems  Constitutional: Negative.   Respiratory: Negative.   Cardiovascular: Negative.   Gastrointestinal: Negative.   Endocrine: Negative.   Musculoskeletal: Negative.   Neurological: Negative.   Hematological: Negative.   Psychiatric/Behavioral: Negative.   All other systems reviewed and are negative.   BP 142/72 mmHg  Pulse 79  Ht 5\' 6"  (1.676 m)  Wt 121 lb 8 oz (55.112  kg)  BMI 19.62 kg/m2  Physical Exam  Constitutional: She is oriented to person, place, and time. She appears well-developed and well-nourished.  HENT:  Head: Normocephalic.  Nose: Nose normal.  Mouth/Throat: Oropharynx is clear and moist.  Eyes: Conjunctivae are normal. Pupils are equal, round, and reactive to light.  Neck: Normal range of motion. Neck supple. No JVD present.  Cardiovascular: Normal rate, regular rhythm, S1 normal, S2 normal, normal heart sounds and intact distal pulses.  Exam reveals no gallop and no friction rub.   No murmur heard. Pulmonary/Chest: Effort normal and breath  sounds normal. No respiratory distress. She has no wheezes. She has no rales. She exhibits no tenderness.  Abdominal: Soft. Bowel sounds are normal. She exhibits no distension. There is no tenderness.  Musculoskeletal: Normal range of motion. She exhibits no edema or tenderness.  Lymphadenopathy:    She has no cervical adenopathy.  Neurological: She is alert and oriented to person, place, and time. Coordination normal.  Skin: Skin is warm and dry. No rash noted. No erythema.  Psychiatric: She has a normal mood and affect. Her behavior is normal. Judgment and thought content normal.    Assessment and Plan  Nursing note and vitals reviewed.

## 2015-06-25 NOTE — Assessment & Plan Note (Signed)
Systolic pressure in our office today 140 on arrival She reports recently well controlled blood pressure at home. Several recent visits to Dr. Jamal Collin with good blood pressure control. She does report having a relatively new blood pressure cuff. Recommended she continue to monitor her blood pressure periodically as she is doing. Would certainly check her blood pressure if she feels lightheaded. Given the prior history of syncope and occasional systolic pressure in the 33A, would not add more medications at this time As she does not seem to have malignant hypertension and it is well controlled, we'll hold off on any further testing such as renal artery ultrasound Suggested she call us if blood pressures continue to be labile.

## 2015-06-25 NOTE — Patient Instructions (Signed)
You are doing well. No medication changes were made.  Please call us if you have new issues that need to be addressed before your next appt.    

## 2015-07-12 ENCOUNTER — Ambulatory Visit (INDEPENDENT_AMBULATORY_CARE_PROVIDER_SITE_OTHER): Payer: Medicare Other | Admitting: General Surgery

## 2015-07-12 ENCOUNTER — Encounter: Payer: Self-pay | Admitting: General Surgery

## 2015-07-12 VITALS — BP 134/70 | HR 80 | Resp 12 | Ht 66.0 in | Wt 123.6 lb

## 2015-07-12 DIAGNOSIS — L729 Follicular cyst of the skin and subcutaneous tissue, unspecified: Secondary | ICD-10-CM

## 2015-07-12 DIAGNOSIS — L089 Local infection of the skin and subcutaneous tissue, unspecified: Secondary | ICD-10-CM

## 2015-07-12 NOTE — Patient Instructions (Signed)
Follow up as needed

## 2015-07-12 NOTE — Progress Notes (Signed)
Patient ID: Brittany Oneal, female   DOB: 1931/11/13, 81 y.o.   MRN: [id number]  Chief Complaint  Patient presents with  . Follow-up    scalp cyst    HPI Brittany Oneal is a 81 y.o. female here for a follow up from an infected scalp cyst. She denies any new problems with this.  HPI  Past Medical History  Diagnosis Date  . Unspecified essential hypertension   . Arthritis   . Family history of malignant neoplasm of gastrointestinal tract   . Breast screening, unspecified   . Special screening for malignant neoplasms, colon   . Personal history of malignant neoplasm of large intestine   . Cancer 1990    colon  . Family history of malignant neoplasm of breast   . Thyroid disease     Past Surgical History  Procedure Laterality Date  . Colon surgery  1990    resection d/t cancer  . 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 Abdominal hysterectomy  1983  . Salpingoophorectomy    . Colonoscopy  1997,2001,2004,2006, 2011  . Eye surgery  Sept 2013    Family History  Problem Relation Age of Onset  . Cancer Other     FH of breast and colon cancer  . Cancer Brother     colon  . Cancer Sister     breast  . Hypertension Mother   . Hypertension Father     Social History Social History  Substance Use Topics  . Smoking status: Never Smoker   . Smokeless tobacco: Never Used  . Alcohol Use: No    Allergies  Allergen Reactions  . Contrast Media [Iodinated Diagnostic Agents] Other (See Comments)    Reaction not listed  . Sulfa Antibiotics Hives  . Shellfish Allergy Rash    Current Outpatient Prescriptions  Medication Sig Dispense Refill  . amLODipine (NORVASC) 5 MG tablet Take 2.5 mg by mouth daily.     . calcium carbonate (OS-CAL - DOSED IN MG OF ELEMENTAL CALCIUM) 1250 MG tablet Take 1 tablet by mouth.    . fluticasone (FLONASE) 50 MCG/ACT nasal spray Place 1 spray into the nose as needed.    . Glucosamine-Chondroitin (GLUCOSAMINE CHONDR COMPLEX) 500-400 MG  CAPS Take 2 capsules by mouth daily.    Marland Kitchen levothyroxine (SYNTHROID, LEVOTHROID) 50 MCG tablet Take 50 mcg by mouth daily before breakfast.    . lisinopril (PRINIVIL,ZESTRIL) 40 MG tablet Take 40 mg by mouth daily.    Marland Kitchen loratadine (CLARITIN) 10 MG tablet Take 10 mg by mouth daily as needed for allergies.    . Omega-3 Fatty Acids (FISH OIL) 1000 MG CAPS Take 2 capsules by mouth daily.    . Vitamin E 100 UNITS TABS Take 1 tablet by mouth daily.     No current facility-administered medications for this visit.    Review of Systems Review of Systems  Constitutional: Negative.   Respiratory: Negative.   Cardiovascular: Negative.     Blood pressure 134/70, pulse 80, resp. rate 12, height 5\' 6"  (1.676 m), weight 123 lb 9.6 oz (56.065 kg).  Physical Exam Physical Exam  Constitutional: She is oriented to person, place, and time. She appears well-developed and well-nourished.  Neurological: She is alert and oriented to person, place, and time.  Skin: Skin is warm and dry.  3 mm scar is visible and barely palpable on the back of the scalp. No mass is noted.     Data  Reviewed    Assessment    Resolved skin cyst scalp     Plan    F/U as scheduled     PCP: Dr Michel Bickers, Caryl-Lyn M 07/12/2015, 1:46 PM

## 2015-07-15 ENCOUNTER — Encounter: Payer: Self-pay | Admitting: General Surgery

## 2015-08-03 ENCOUNTER — Encounter: Payer: Self-pay | Admitting: Family Medicine

## 2015-08-03 ENCOUNTER — Ambulatory Visit (INDEPENDENT_AMBULATORY_CARE_PROVIDER_SITE_OTHER): Payer: Medicare Other | Admitting: Family Medicine

## 2015-08-03 VITALS — BP 133/78 | HR 80 | Temp 98.2°F | Resp 16 | Ht 66.0 in | Wt 124.8 lb

## 2015-08-03 DIAGNOSIS — M545 Low back pain, unspecified: Secondary | ICD-10-CM | POA: Insufficient documentation

## 2015-08-03 DIAGNOSIS — H6120 Impacted cerumen, unspecified ear: Secondary | ICD-10-CM | POA: Insufficient documentation

## 2015-08-03 DIAGNOSIS — Z23 Encounter for immunization: Secondary | ICD-10-CM

## 2015-08-03 DIAGNOSIS — H6123 Impacted cerumen, bilateral: Secondary | ICD-10-CM | POA: Diagnosis not present

## 2015-08-03 DIAGNOSIS — I10 Essential (primary) hypertension: Secondary | ICD-10-CM

## 2015-08-03 DIAGNOSIS — R630 Anorexia: Secondary | ICD-10-CM | POA: Insufficient documentation

## 2015-08-03 DIAGNOSIS — E875 Hyperkalemia: Secondary | ICD-10-CM | POA: Insufficient documentation

## 2015-08-03 DIAGNOSIS — E871 Hypo-osmolality and hyponatremia: Secondary | ICD-10-CM | POA: Insufficient documentation

## 2015-08-03 DIAGNOSIS — M1990 Unspecified osteoarthritis, unspecified site: Secondary | ICD-10-CM | POA: Insufficient documentation

## 2015-08-03 NOTE — Patient Instructions (Addendum)
Discontinue Amlodipine.  Cont. Other meds.Marland Kitchen

## 2015-08-03 NOTE — Progress Notes (Signed)
Name: Brittany Oneal   MRN: [id number]    DOB: [birth year]   Date:08/03/2015       Progress Note  Subjective  Chief Complaint  Chief Complaint  Patient presents with  . Hypertension    3 month follow up    HPI Here for BP f/u.  BPs at home running often <100 sys,  Often in 10F systolic.  Their BP cuff measures about 10 mmHg  less than ours.  She fees "bad" when BP so low.  No problem-specific assessment & plan notes found for this encounter.   Past Medical History  Diagnosis Date  . Unspecified essential hypertension   . Arthritis   . Family history of malignant neoplasm of gastrointestinal tract   . Breast screening, unspecified   . Special screening for malignant neoplasms, colon   . Personal history of malignant neoplasm of large intestine   . Cancer 1990    colon  . Family history of malignant neoplasm of breast   . Thyroid disease     Social History  Substance Use Topics  . Smoking status: Never Smoker   . Smokeless tobacco: Never Used  . Alcohol Use: No     Current outpatient prescriptions:  .  amLODipine (NORVASC) 5 MG tablet, Take 2.5 mg by mouth daily. , Disp: , Rfl:  .  calcium carbonate (OS-CAL - DOSED IN MG OF ELEMENTAL CALCIUM) 1250 MG tablet, Take 1 tablet by mouth., Disp: , Rfl:  .  fluticasone (FLONASE) 50 MCG/ACT nasal spray, Place 1 spray into the nose as needed., Disp: , Rfl:  .  Glucosamine-Chondroitin (GLUCOSAMINE CHONDR COMPLEX) 500-400 MG CAPS, Take 2 capsules by mouth daily., Disp: , Rfl:  .  levothyroxine (SYNTHROID, LEVOTHROID) 50 MCG tablet, Take 50 mcg by mouth daily before breakfast., Disp: , Rfl:  .  lisinopril (PRINIVIL,ZESTRIL) 40 MG tablet, Take 40 mg by mouth daily., Disp: , Rfl:  .  loratadine (CLARITIN) 10 MG tablet, Take 10 mg by mouth daily as needed for allergies., Disp: , Rfl:  .  Omega-3 Fatty Acids (FISH OIL) 1000 MG CAPS, Take 2 capsules by mouth daily., Disp: , Rfl:  .  Vitamin E 100 UNITS TABS, Take 1 tablet by mouth daily.,  Disp: , Rfl:   Allergies  Allergen Reactions  . Contrast Media [Iodinated Diagnostic Agents] Other (See Comments)    Reaction not listed  . Sulfa Antibiotics Hives  . Shellfish Allergy Rash    Review of Systems  Constitutional: Positive for malaise/fatigue. Negative for fever and chills.  HENT: Positive for hearing loss.   Eyes: Negative for blurred vision and double vision.  Respiratory: Negative for cough, sputum production, shortness of breath and wheezing.   Cardiovascular: Negative for chest pain, palpitations, orthopnea and leg swelling.  Gastrointestinal: Negative for heartburn, abdominal pain and blood in stool.  Genitourinary: Negative for dysuria, urgency and frequency.  Musculoskeletal: Negative for myalgias and back pain.  Skin: Negative for rash.  Neurological: Positive for weakness. Negative for dizziness, sensory change, focal weakness and headaches.      Objective  Filed Vitals:   08/03/15 1504  BP: 133/78  Pulse: 80  Temp: 98.2 F (36.8 C)  TempSrc: Oral  Resp: 16  Height: 5\' 6"  (1.676 m)  Weight: 124 lb 12.8 oz (56.609 kg)     Physical Exam  Constitutional: She is well-developed, well-nourished, and in no distress. No distress.  HENT:  Head: Normocephalic and atraumatic.  Eyes:  Bilateral hard cerumen impaction.  Neck:  Normal range of motion. Neck supple. Carotid bruit is not present. No thyromegaly present.  Cardiovascular: Normal rate, regular rhythm, normal heart sounds and intact distal pulses.  Exam reveals no gallop and no friction rub.   No murmur heard. Pulmonary/Chest: Effort normal and breath sounds normal. No respiratory distress. She has no wheezes. She has no rales.  Abdominal: Soft. Bowel sounds are normal. She exhibits no distension and no mass. There is no tenderness.  Musculoskeletal: Edema: trace to 1+ wedema of bilateral ankles.  Lymphadenopathy:    She has no cervical adenopathy.  Vitals reviewed.     No results found  for this or any previous visit (from the past 2160 hour(s)).   Assessment & Plan  1. Essential hypertension   2. Need for influenza vaccination  - Flu vaccine HIGH DOSE PF (Fluzone High dose)  3. Cerumen impaction, bilateral  - Ambulatory referral to ENT

## 2015-08-09 ENCOUNTER — Telehealth: Payer: Self-pay | Admitting: *Deleted

## 2015-08-09 NOTE — Telephone Encounter (Signed)
Left message on patient home phone ENT appt scheduled 08/12/15 @ 9:15 with Dr. Richardson Landry.

## 2015-08-23 ENCOUNTER — Encounter: Payer: Self-pay | Admitting: General Surgery

## 2015-08-24 ENCOUNTER — Ambulatory Visit (INDEPENDENT_AMBULATORY_CARE_PROVIDER_SITE_OTHER): Payer: Medicare Other | Admitting: General Surgery

## 2015-08-24 ENCOUNTER — Encounter: Payer: Self-pay | Admitting: General Surgery

## 2015-08-24 ENCOUNTER — Ambulatory Visit: Payer: Medicare Other | Admitting: General Surgery

## 2015-08-24 VITALS — BP 158/80 | HR 76 | Resp 12 | Ht 66.0 in | Wt 126.0 lb

## 2015-08-24 DIAGNOSIS — Z85038 Personal history of other malignant neoplasm of large intestine: Secondary | ICD-10-CM | POA: Diagnosis not present

## 2015-08-24 DIAGNOSIS — Z803 Family history of malignant neoplasm of breast: Secondary | ICD-10-CM | POA: Diagnosis not present

## 2015-08-24 NOTE — Patient Instructions (Addendum)
Patient will be asked to return to the office in one year with a bilateral screening mammogram. Call to schedule colonoscopy when you are ready.

## 2015-08-24 NOTE — Progress Notes (Signed)
Patient ID: Brittany Oneal, female   DOB: [birth year], 81 y.o.   MRN: [id number]  Chief Complaint  Patient presents with  . Follow-up    mammogram    HPI Brittany Oneal is a 81 y.o. female who presents for a breast evaluation. The most recent mammogram was done on 08/12/15. Patient does perform regular self breast checks and gets regular mammograms done.    HPI  Past Medical History  Diagnosis Date  . Unspecified essential hypertension   . Arthritis   . Family history of malignant neoplasm of gastrointestinal tract   . Breast screening, unspecified   . Special screening for malignant neoplasms, colon   . Personal history of malignant neoplasm of large intestine   . Cancer 1990    colon  . Family history of malignant neoplasm of breast   . Thyroid disease     Past Surgical History  Procedure Laterality Date  . Colon surgery  1990    resection d/t cancer  . 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 Abdominal hysterectomy  1983  . Salpingoophorectomy    . Colonoscopy  1997,2001,2004,2006, 2011  . Eye surgery  Sept 2013    Family History  Problem Relation Age of Onset  . Cancer Other     FH of breast and colon cancer  . Cancer Brother     colon  . Cancer Sister     breast  . Hypertension Mother   . Hypertension Father     Social History Social History  Substance Use Topics  . Smoking status: Never Smoker   . Smokeless tobacco: Never Used  . Alcohol Use: No    Allergies  Allergen Reactions  . Contrast Media [Iodinated Diagnostic Agents] Other (See Comments)    Reaction not listed  . Sulfa Antibiotics Hives  . Shellfish Allergy Rash    Current Outpatient Prescriptions  Medication Sig Dispense Refill  . calcium carbonate (OS-CAL - DOSED IN MG OF ELEMENTAL CALCIUM) 1250 MG tablet Take 1 tablet by mouth.    . fluticasone (FLONASE) 50 MCG/ACT nasal spray Place 1 spray into the nose as needed.    . Glucosamine-Chondroitin (GLUCOSAMINE CHONDR  COMPLEX) 500-400 MG CAPS Take 2 capsules by mouth daily.    Marland Kitchen levothyroxine (SYNTHROID, LEVOTHROID) 50 MCG tablet Take 50 mcg by mouth daily before breakfast.    . lisinopril (PRINIVIL,ZESTRIL) 40 MG tablet Take 40 mg by mouth daily.    Marland Kitchen loratadine (CLARITIN) 10 MG tablet Take 10 mg by mouth daily as needed for allergies.    . Omega-3 Fatty Acids (FISH OIL) 1000 MG CAPS Take 2 capsules by mouth daily.    . Vitamin E 100 UNITS TABS Take 1 tablet by mouth daily.     No current facility-administered medications for this visit.    Review of Systems Review of Systems  Constitutional: Negative.   Respiratory: Negative.   Cardiovascular: Negative.     Blood pressure 158/80, pulse 76, resp. rate 12, height 5\' 6"  (1.676 m), weight 126 lb (57.153 kg).  Physical Exam Physical Exam  Constitutional: She is oriented to person, place, and time. She appears well-developed and well-nourished.  Eyes: Conjunctivae are normal. No scleral icterus.  Neck: Neck supple.  Cardiovascular: Normal rate, regular rhythm and normal heart sounds.   Pulmonary/Chest: Effort normal and breath sounds normal. Right breast exhibits no inverted nipple, no mass, no nipple discharge, no skin change and no tenderness. Left breast exhibits  no inverted nipple, no mass, no nipple discharge, no skin change and no tenderness.  Abdominal: Soft. Bowel sounds are normal.  Lymphadenopathy:    She has no cervical adenopathy.    She has no axillary adenopathy.  Neurological: She is alert and oriented to person, place, and time.  Skin: Skin is warm and dry.    Data Reviewed Mammogram reviewed  Assessment    History of colon cancer. Family history of breast cancer. Stable breast exam.    Plan    The patient has been asked to return to the office in one year with a bilateral screening mammogram.   Discussed colonoscopy. Patient will schedule later. Last colonoscopy 5 years ago and all colonoscopies since colon resection  were normal.  PCP:  Jaynie Bream 08/24/2015, 3:03 PM

## 2015-10-13 ENCOUNTER — Encounter: Payer: Self-pay | Admitting: Family Medicine

## 2015-10-13 ENCOUNTER — Ambulatory Visit (INDEPENDENT_AMBULATORY_CARE_PROVIDER_SITE_OTHER): Payer: Medicare Other | Admitting: Family Medicine

## 2015-10-13 VITALS — BP 180/80 | HR 77 | Resp 16 | Ht 66.0 in | Wt 126.2 lb

## 2015-10-13 DIAGNOSIS — R7309 Other abnormal glucose: Secondary | ICD-10-CM | POA: Insufficient documentation

## 2015-10-13 DIAGNOSIS — R739 Hyperglycemia, unspecified: Secondary | ICD-10-CM

## 2015-10-13 DIAGNOSIS — I10 Essential (primary) hypertension: Secondary | ICD-10-CM

## 2015-10-13 DIAGNOSIS — R7303 Prediabetes: Secondary | ICD-10-CM

## 2015-10-13 LAB — POCT GLYCOSYLATED HEMOGLOBIN (HGB A1C): Hemoglobin A1C: 5.7

## 2015-10-13 MED ORDER — LISINOPRIL 40 MG PO TABS: ORAL_TABLET | ORAL | Status: DC

## 2015-10-13 NOTE — Progress Notes (Signed)
Name: Brittany Oneal   MRN: [id number]    DOB: 04/07/1931   Date:10/13/2015       Progress Note  Subjective  Chief Complaint  Chief Complaint  Patient presents with  . Hypertension    HPI Here for f/u of HBP.  BPs at home range from 90-115/55-70.  She feels better in AMs.  In PMs, Bp get lower and she feels more tired.  No problem-specific assessment & plan notes found for this encounter.   Past Medical History  Diagnosis Date  . Unspecified essential hypertension   . Arthritis   . Family history of malignant neoplasm of gastrointestinal tract   . Breast screening, unspecified   . Special screening for malignant neoplasms, colon   . Personal history of malignant neoplasm of large intestine   . Cancer ([place]) 1990    colon  . Family history of malignant neoplasm of breast   . Thyroid disease     Social History  Substance Use Topics  . Smoking status: Never Smoker   . Smokeless tobacco: Never Used  . Alcohol Use: No     Current outpatient prescriptions:  .  calcium carbonate (OS-CAL - DOSED IN MG OF ELEMENTAL CALCIUM) 1250 MG tablet, Take 1 tablet by mouth., Disp: , Rfl:  .  fluticasone (FLONASE) 50 MCG/ACT nasal spray, Place 1 spray into the nose as needed., Disp: , Rfl:  .  Glucosamine-Chondroitin (GLUCOSAMINE CHONDR COMPLEX) 500-400 MG CAPS, Take 2 capsules by mouth daily., Disp: , Rfl:  .  levothyroxine (SYNTHROID, LEVOTHROID) 50 MCG tablet, Take 50 mcg by mouth daily before breakfast., Disp: , Rfl:  .  lisinopril (PRINIVIL,ZESTRIL) 40 MG tablet, Take 40 mg by mouth daily., Disp: , Rfl:  .  loratadine (CLARITIN) 10 MG tablet, Take 10 mg by mouth daily as needed for allergies., Disp: , Rfl:  .  Omega-3 Fatty Acids (FISH OIL) 1000 MG CAPS, Take 2 capsules by mouth daily., Disp: , Rfl:   Allergies  Allergen Reactions  . Contrast Media [Iodinated Diagnostic Agents] Other (See Comments)    Reaction not listed  . Sulfa Antibiotics Hives  . Shellfish Allergy Rash     Review of Systems  Constitutional: Positive for malaise/fatigue. Negative for fever, chills and weight loss.  HENT: Negative for hearing loss.   Eyes: Negative for blurred vision and double vision.  Respiratory: Negative for cough, sputum production, shortness of breath and wheezing.   Cardiovascular: Negative for chest pain, palpitations and leg swelling.  Gastrointestinal: Negative for heartburn, abdominal pain and blood in stool.  Genitourinary: Negative for dysuria, urgency and frequency.  Musculoskeletal: Negative for myalgias and joint pain.  Neurological: Positive for weakness. Negative for dizziness, tremors and headaches.      Objective  Filed Vitals:   10/13/15 1116  BP: 175/80  Pulse: 77  Resp: 16  Height: 5\' 6"  (1.676 m)  Weight: 126 lb 3.2 oz (57.244 kg)     Physical Exam  Constitutional: She is oriented to person, place, and time and well-developed, well-nourished, and in no distress. No distress.  HENT:  Head: Normocephalic and atraumatic.  Eyes: Conjunctivae and EOM are normal. Pupils are equal, round, and reactive to light. No scleral icterus.  Neck: Normal range of motion. Neck supple. Carotid bruit is not present. No thyromegaly present.  Cardiovascular: Normal rate, regular rhythm, normal heart sounds and intact distal pulses.  Exam reveals no gallop and no friction rub.   No murmur heard. Pulmonary/Chest: Effort normal and breath sounds  normal. No respiratory distress. She has no wheezes. She has no rales.  Abdominal: Soft. Bowel sounds are normal. She exhibits no distension, no abdominal bruit and no mass. There is no tenderness.  Musculoskeletal: She exhibits no edema.  Lymphadenopathy:    She has no cervical adenopathy.  Neurological: She is alert and oriented to person, place, and time.  Vitals reviewed.     No results found for this or any previous visit (from the past 2160 hour(s)).   Assessment & Plan  1. Essential hypertension  -  lisinopril (PRINIVIL,ZESTRIL) 40 MG tablet; Take 1/2 tablet twice a day.  Dispense: 90 tablet; Refill: 3  2. Hyperglycemia  - POCT HgB A1C-5.7 Eat a little less sugars and starches.

## 2015-10-13 NOTE — Patient Instructions (Signed)
Start taking 1/2 Lisinopril tablet. Twice a day.

## 2016-01-18 ENCOUNTER — Ambulatory Visit (INDEPENDENT_AMBULATORY_CARE_PROVIDER_SITE_OTHER): Payer: Medicare Other | Admitting: Family Medicine

## 2016-01-18 ENCOUNTER — Encounter: Payer: Self-pay | Admitting: Family Medicine

## 2016-01-18 VITALS — BP 150/75 | HR 77 | Resp 16 | Ht 66.0 in | Wt 128.0 lb

## 2016-01-18 DIAGNOSIS — R739 Hyperglycemia, unspecified: Secondary | ICD-10-CM

## 2016-01-18 DIAGNOSIS — Z85038 Personal history of other malignant neoplasm of large intestine: Secondary | ICD-10-CM | POA: Diagnosis not present

## 2016-01-18 DIAGNOSIS — E039 Hypothyroidism, unspecified: Secondary | ICD-10-CM | POA: Diagnosis not present

## 2016-01-18 DIAGNOSIS — I10 Essential (primary) hypertension: Secondary | ICD-10-CM | POA: Diagnosis not present

## 2016-01-18 NOTE — Progress Notes (Signed)
Name: Brittany Oneal   MRN: [id number]    DOB: 12/18/1930   Date:01/18/2016       Progress Note  Subjective  Chief Complaint  Chief Complaint  Patient presents with  . Hypertension  . Fall    HPI Here for f/u of HBP.  BPs at home between 95-140 sys with most in 115s to 125s.  Shew generally feels well.  She had a fall about 2 weeks ago.  Goinf from bed to BR in AM, just fell against a door.  No real injury.  Has seen Derm with skin biopsies last week.  No problem-specific assessment & plan notes found for this encounter.   Past Medical History  Diagnosis Date  . Unspecified essential hypertension   . Arthritis   . Family history of malignant neoplasm of gastrointestinal tract   . Breast screening, unspecified   . Special screening for malignant neoplasms, colon   . Personal history of malignant neoplasm of large intestine   . Cancer ([place]) 1990    colon  . Family history of malignant neoplasm of breast   . Thyroid disease     Past Surgical History  Procedure Laterality Date  . Colon surgery  1990    resection d/t cancer  . 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 Abdominal hysterectomy  1983  . Salpingoophorectomy    . Colonoscopy  1997,2001,2004,2006, 2011  . Eye surgery  Sept 2013    Family History  Problem Relation Age of Onset  . Cancer Other     FH of breast and colon cancer  . Cancer Brother     colon  . Cancer Sister     breast  . Hypertension Mother   . Hypertension Father     Social History   Social History  . Marital Status: Married    Spouse Name: N/A  . Number of Children: N/A  . Years of Education: N/A   Occupational History  . Not on file.   Social History Main Topics  . Smoking status: Never Smoker   . Smokeless tobacco: Never Used  . Alcohol Use: No  . Drug Use: No  . Sexual Activity: Not on file   Other Topics Concern  . Not on file   Social History Narrative     Current outpatient prescriptions:  .   calcium carbonate (OS-CAL - DOSED IN MG OF ELEMENTAL CALCIUM) 1250 MG tablet, Take 1 tablet by mouth., Disp: , Rfl:  .  fluticasone (FLONASE) 50 MCG/ACT nasal spray, Place 1 spray into the nose as needed., Disp: , Rfl:  .  Glucosamine-Chondroitin (GLUCOSAMINE CHONDR COMPLEX) 500-400 MG CAPS, Take 2 capsules by mouth daily., Disp: , Rfl:  .  levothyroxine (SYNTHROID, LEVOTHROID) 50 MCG tablet, Take 50 mcg by mouth daily before breakfast., Disp: , Rfl:  .  lisinopril (PRINIVIL,ZESTRIL) 40 MG tablet, Take 1/2 tablet twice a day., Disp: 90 tablet, Rfl: 3 .  loratadine (CLARITIN) 10 MG tablet, Take 10 mg by mouth daily as needed for allergies., Disp: , Rfl:  .  Omega-3 Fatty Acids (FISH OIL) 1000 MG CAPS, Take 2 capsules by mouth daily., Disp: , Rfl:   Allergies  Allergen Reactions  . Contrast Media [Iodinated Diagnostic Agents] Other (See Comments)    Reaction not listed  . Sulfa Antibiotics Hives  . Shellfish Allergy Rash     Review of Systems  Constitutional: Negative for fever, chills, weight loss and malaise/fatigue.  HENT:  Negative for hearing loss.   Eyes: Negative for blurred vision and double vision.  Respiratory: Negative for cough, shortness of breath and wheezing.   Cardiovascular: Negative for chest pain, palpitations and leg swelling.  Gastrointestinal: Negative for heartburn, abdominal pain and blood in stool.  Genitourinary: Negative for dysuria, urgency and frequency.  Musculoskeletal: Negative for myalgias and joint pain.  Neurological: Negative for dizziness, tremors, weakness and headaches.      Objective  Filed Vitals:   01/18/16 1336 01/18/16 1412  BP: 175/78 150/75  Pulse: 77   Resp: 16   Height: 5\' 6"  (1.676 m)   Weight: 128 lb (58.06 kg)   SpO2: 100%     Physical Exam  Constitutional: She is oriented to person, place, and time and well-developed, well-nourished, and in no distress. No distress.  HENT:  Head: Normocephalic and atraumatic.  Eyes:  Conjunctivae and EOM are normal. Pupils are equal, round, and reactive to light. No scleral icterus.  Neck: Normal range of motion. Neck supple. Carotid bruit is not present. No thyromegaly present.  Cardiovascular: Normal rate, regular rhythm and normal heart sounds.  Exam reveals no gallop and no friction rub.   No murmur heard. Pulmonary/Chest: Effort normal and breath sounds normal. No respiratory distress. She has no wheezes. She has no rales.  Abdominal: Soft. Bowel sounds are normal. She exhibits no distension, no abdominal bruit and no mass. There is no tenderness.  Musculoskeletal: She exhibits no edema.  Lymphadenopathy:    She has no cervical adenopathy.  Neurological: She is alert and oriented to person, place, and time. No cranial nerve deficit. Gait normal. Coordination normal.  Vitals reviewed.      No results found for this or any previous visit (from the past 2160 hour(s)).   Assessment & Plan  Problem List Items Addressed This Visit      Cardiovascular and Mediastinum   Hypertension - Primary (Chronic)   Relevant Orders   Comprehensive Metabolic Panel (CMET)   Lipid Profile     Endocrine   Hypothyroidism (Chronic)   Relevant Orders   TSH     Other   History of colon cancer   Relevant Orders   CBC with Differential   Hyperglycemia   Relevant Orders   HgB A1c      No orders of the defined types were placed in this encounter.   1. Essential hypertension  - Comprehensive Metabolic Panel (CMET) - Lipid Profile  2. Hypothyroidism, unspecified hypothyroidism type  - TSH  3. History of colon cancer  - CBC with Differential  4. Hyperglycemia  - HgB A1c

## 2016-01-27 LAB — CBC WITH DIFFERENTIAL/PLATELET
BASOS ABS: 0 10*3/uL (ref 0.0–0.2)
Basos: 1 %
EOS (ABSOLUTE): 0.1 10*3/uL (ref 0.0–0.4)
Eos: 3 %
Hematocrit: 38.6 % (ref 34.0–46.6)
Hemoglobin: 13.7 g/dL (ref 11.1–15.9)
Immature Grans (Abs): 0 10*3/uL (ref 0.0–0.1)
Immature Granulocytes: 0 %
LYMPHS ABS: 1.8 10*3/uL (ref 0.7–3.1)
Lymphs: 45 %
MCH: 29.7 pg (ref 26.6–33.0)
MCHC: 35.5 g/dL (ref 31.5–35.7)
MCV: 84 fL (ref 79–97)
MONOCYTES: 11 %
Monocytes Absolute: 0.4 10*3/uL (ref 0.1–0.9)
Neutrophils Absolute: 1.6 10*3/uL (ref 1.4–7.0)
Neutrophils: 40 %
Platelets: 192 10*3/uL (ref 150–379)
RBC: 4.62 x10E6/uL (ref 3.77–5.28)
RDW: 13.5 % (ref 12.3–15.4)
WBC: 3.9 10*3/uL (ref 3.4–10.8)

## 2016-01-27 LAB — COMPREHENSIVE METABOLIC PANEL
ALK PHOS: 59 IU/L (ref 39–117)
ALT: 11 IU/L (ref 0–32)
AST: 17 IU/L (ref 0–40)
Albumin/Globulin Ratio: 2 (ref 1.1–2.5)
Albumin: 4.4 g/dL (ref 3.5–4.7)
BILIRUBIN TOTAL: 0.7 mg/dL (ref 0.0–1.2)
BUN / CREAT RATIO: 20 (ref 11–26)
BUN: 16 mg/dL (ref 8–27)
CHLORIDE: 95 mmol/L — AB (ref 96–106)
CO2: 25 mmol/L (ref 18–29)
Calcium: 9.4 mg/dL (ref 8.7–10.3)
Creatinine, Ser: 0.8 mg/dL (ref 0.57–1.00)
GFR calc Af Amer: 79 mL/min/{1.73_m2} (ref >59)
GFR calc non Af Amer: 69 mL/min/{1.73_m2} (ref >59)
GLUCOSE: 77 mg/dL (ref 65–99)
Globulin, Total: 2.2 g/dL (ref 1.5–4.5)
Potassium: 4.6 mmol/L (ref 3.5–5.2)
Sodium: 137 mmol/L (ref 134–144)
Total Protein: 6.6 g/dL (ref 6.0–8.5)

## 2016-01-27 LAB — LIPID PANEL
CHOLESTEROL TOTAL: 192 mg/dL (ref 100–199)
Chol/HDL Ratio: 2.6 ratio units (ref 0.0–4.4)
HDL: 74 mg/dL (ref >39)
LDL Calculated: 104 mg/dL — ABNORMAL HIGH (ref 0–99)
TRIGLYCERIDES: 69 mg/dL (ref 0–149)
VLDL Cholesterol Cal: 14 mg/dL (ref 5–40)

## 2016-01-27 LAB — HEMOGLOBIN A1C
Est. average glucose Bld gHb Est-mCnc: 114 mg/dL
HEMOGLOBIN A1C: 5.6 % (ref 4.8–5.6)

## 2016-01-27 LAB — TSH: TSH: 1.66 u[IU]/mL (ref 0.450–4.500)

## 2016-04-06 ENCOUNTER — Other Ambulatory Visit: Payer: Self-pay | Admitting: Family Medicine

## 2016-04-18 ENCOUNTER — Encounter: Payer: Self-pay | Admitting: Family Medicine

## 2016-04-18 ENCOUNTER — Ambulatory Visit (INDEPENDENT_AMBULATORY_CARE_PROVIDER_SITE_OTHER): Payer: Medicare Other | Admitting: Family Medicine

## 2016-04-18 VITALS — BP 175/90 | HR 76 | Temp 97.7°F | Resp 16 | Ht 66.0 in | Wt 121.0 lb

## 2016-04-18 DIAGNOSIS — Z87898 Personal history of other specified conditions: Secondary | ICD-10-CM

## 2016-04-18 DIAGNOSIS — I10 Essential (primary) hypertension: Secondary | ICD-10-CM | POA: Diagnosis not present

## 2016-04-18 DIAGNOSIS — Z9189 Other specified personal risk factors, not elsewhere classified: Secondary | ICD-10-CM

## 2016-04-18 MED ORDER — CLONIDINE HCL 0.1 MG PO TABS: ORAL_TABLET | ORAL | Status: DC

## 2016-04-18 NOTE — Progress Notes (Signed)
Name: Brittany Oneal   MRN: [id number]    DOB: [birth year]   Date:04/18/2016       Progress Note  Subjective  Chief Complaint  Chief Complaint  Patient presents with  . Hypertension  . Diabetes    last a1c 01/26/16 5.6%    HPI Here for f./u of HBP.  BPs at home in 120 sys range usually.  Never high.  Also would like bone density test scheduled.  No problem-specific assessment & plan notes found for this encounter.   Past Medical History  Diagnosis Date  . Unspecified essential hypertension   . Arthritis   . Family history of malignant neoplasm of gastrointestinal tract   . Breast screening, unspecified   . Special screening for malignant neoplasms, colon   . Personal history of malignant neoplasm of large intestine   . Cancer ([place]) 1990    colon  . Family history of malignant neoplasm of breast   . Thyroid disease     Past Surgical History  Procedure Laterality Date  . Colon surgery  1990    resection d/t cancer  . 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 Abdominal hysterectomy  1983  . Salpingoophorectomy    . Colonoscopy  1997,2001,2004,2006, 2011  . Eye surgery  Sept 2013    Family History  Problem Relation Age of Onset  . Cancer Other     FH of breast and colon cancer  . Cancer Brother     colon  . Cancer Sister     breast  . Hypertension Mother   . Hypertension Father     Social History   Social History  . Marital Status: Married    Spouse Name: N/A  . Number of Children: N/A  . Years of Education: N/A   Occupational History  . Not on file.   Social History Main Topics  . Smoking status: Never Smoker   . Smokeless tobacco: Never Used  . Alcohol Use: No  . Drug Use: No  . Sexual Activity: Not on file   Other Topics Concern  . Not on file   Social History Narrative     Current outpatient prescriptions:  .  calcium carbonate (OS-CAL - DOSED IN MG OF ELEMENTAL CALCIUM) 1250 MG tablet, Take 1 tablet by mouth., Disp: , Rfl:   .  fluticasone (FLONASE) 50 MCG/ACT nasal spray, Place 1 spray into the nose as needed., Disp: , Rfl:  .  Glucosamine-Chondroitin (GLUCOSAMINE CHONDR COMPLEX) 500-400 MG CAPS, Take 2 capsules by mouth daily., Disp: , Rfl:  .  levothyroxine (SYNTHROID, LEVOTHROID) 50 MCG tablet, TAKE 1 TABLET DAILY, Disp: 90 tablet, Rfl: 3 .  lisinopril (PRINIVIL,ZESTRIL) 40 MG tablet, TAKE 1 TABLET DAILY (Patient taking differently: Takes 1/2 tablet  twice a day), Disp: 90 tablet, Rfl: 3 .  loratadine (CLARITIN) 10 MG tablet, Take 10 mg by mouth daily as needed for allergies., Disp: , Rfl:  .  Omega-3 Fatty Acids (FISH OIL) 1000 MG CAPS, Take 2 capsules by mouth daily., Disp: , Rfl:  .  cloNIDine (CATAPRES) 0.1 MG tablet, Take 1 tablet up to twice a day for systolic blood pressures greater than 160., Disp: 30 tablet, Rfl: 3  Allergies  Allergen Reactions  . Contrast Media [Iodinated Diagnostic Agents] Other (See Comments)    Reaction not listed  . Sulfa Antibiotics Hives  . Shellfish Allergy Rash     Review of Systems  Constitutional: Negative for fever, chills,  weight loss and malaise/fatigue.  HENT: Negative for hearing loss.   Eyes: Negative for blurred vision and double vision.  Respiratory: Negative for cough, shortness of breath and wheezing.   Cardiovascular: Negative for chest pain, palpitations and leg swelling.  Gastrointestinal: Negative for heartburn, abdominal pain and blood in stool.  Genitourinary: Negative for dysuria, urgency and frequency.  Musculoskeletal: Positive for joint pain (OA of fingers). Negative for myalgias.  Skin: Negative for rash.  Neurological: Negative for tremors, weakness and headaches.      Objective  Filed Vitals:   04/18/16 1321 04/18/16 1354  BP: 178/91 175/90  Pulse: 76   Temp: 97.7 F (36.5 C)   TempSrc: Oral   Resp: 16   Height: 5\' 6"  (1.676 m)   Weight: 121 lb (54.885 kg)     Physical Exam  Constitutional: She is oriented to person, place,  and time and well-developed, well-nourished, and in no distress. No distress.  HENT:  Head: Normocephalic and atraumatic.  Eyes: Conjunctivae and EOM are normal. Pupils are equal, round, and reactive to light. No scleral icterus.  Neck: Normal range of motion. Neck supple. Carotid bruit is not present. No thyromegaly present.  Cardiovascular: Normal rate, regular rhythm and normal heart sounds.  Exam reveals no gallop and no friction rub.   No murmur heard. Pulmonary/Chest: Effort normal and breath sounds normal. No respiratory distress. She has no wheezes. She has no rales.  Abdominal: Soft. Bowel sounds are normal. She exhibits no distension. There is no tenderness. There is no rebound.  Musculoskeletal: She exhibits no edema.  Lymphadenopathy:    She has no cervical adenopathy.  Neurological: She is alert and oriented to person, place, and time.  Vitals reviewed.      Recent Results (from the past 2160 hour(s))  Comprehensive Metabolic Panel (CMET)     Status: Abnormal   Collection Time: 01/26/16  9:40 AM  Result Value Ref Range   Glucose 77 65 - 99 mg/dL   BUN 16 8 - 27 mg/dL   Creatinine, Ser 0.80 0.57 - 1.00 mg/dL   GFR calc non Af Amer 69 >59 mL/min/1.73   GFR calc Af Amer 79 >59 mL/min/1.73   BUN/Creatinine Ratio 20 11 - 26   Sodium 137 134 - 144 mmol/L   Potassium 4.6 3.5 - 5.2 mmol/L   Chloride 95 (L) 96 - 106 mmol/L   CO2 25 18 - 29 mmol/L   Calcium 9.4 8.7 - 10.3 mg/dL   Total Protein 6.6 6.0 - 8.5 g/dL   Albumin 4.4 3.5 - 4.7 g/dL   Globulin, Total 2.2 1.5 - 4.5 g/dL   Albumin/Globulin Ratio 2.0 1.1 - 2.5    Comment: **Effective February 07, 2016 the reference interval**   for A/G Ratio will be changing to:              Age                Female          Female           0 -  7 days       1.1 - 2.3       1.1 - 2.3           8 - 30 days       1.2 - 2.8       1.2 - 2.8           1 -  6 months  1.3 - 3.6       1.3 - 3.6    7 months -  5 years      1.5 - 2.6        1.5 - 2.6              > 5 years      1.2 - 2.2       1.2 - 2.2    Bilirubin Total 0.7 0.0 - 1.2 mg/dL   Alkaline Phosphatase 59 39 - 117 IU/L   AST 17 0 - 40 IU/L   ALT 11 0 - 32 IU/L  CBC with Differential     Status: None   Collection Time: 01/26/16  9:40 AM  Result Value Ref Range   WBC 3.9 3.4 - 10.8 x10E3/uL   RBC 4.62 3.77 - 5.28 x10E6/uL   Hemoglobin 13.7 11.1 - 15.9 g/dL   Hematocrit 38.6 34.0 - 46.6 %   MCV 84 79 - 97 fL   MCH 29.7 26.6 - 33.0 pg   MCHC 35.5 31.5 - 35.7 g/dL   RDW 13.5 12.3 - 15.4 %   Platelets 192 150 - 379 x10E3/uL   Neutrophils 40 %   Lymphs 45 %   Monocytes 11 %   Eos 3 %   Basos 1 %   Neutrophils Absolute 1.6 1.4 - 7.0 x10E3/uL   Lymphocytes Absolute 1.8 0.7 - 3.1 x10E3/uL   Monocytes Absolute 0.4 0.1 - 0.9 x10E3/uL   EOS (ABSOLUTE) 0.1 0.0 - 0.4 x10E3/uL   Basophils Absolute 0.0 0.0 - 0.2 x10E3/uL   Immature Granulocytes 0 %   Immature Grans (Abs) 0.0 0.0 - 0.1 x10E3/uL  Lipid Profile     Status: Abnormal   Collection Time: 01/26/16  9:40 AM  Result Value Ref Range   Cholesterol, Total 192 100 - 199 mg/dL   Triglycerides 69 0 - 149 mg/dL   HDL 74 >39 mg/dL   VLDL Cholesterol Cal 14 5 - 40 mg/dL   LDL Calculated 104 (H) 0 - 99 mg/dL   Chol/HDL Ratio 2.6 0.0 - 4.4 ratio units    Comment:                                   T. Chol/HDL Ratio                                             Men  Women                               1/2 Avg.Risk  3.4    3.3                                   Avg.Risk  5.0    4.4                                2X Avg.Risk  9.6    7.1  3X Avg.Risk 23.4   11.0   TSH     Status: None   Collection Time: 01/26/16  9:40 AM  Result Value Ref Range   TSH 1.660 0.450 - 4.500 uIU/mL  HgB A1c     Status: None   Collection Time: 01/26/16  9:40 AM  Result Value Ref Range   Hgb A1c MFr Bld 5.6 4.8 - 5.6 %    Comment:          Pre-diabetes: 5.7 - 6.4          Diabetes: >6.4          Glycemic  control for adults with diabetes: <7.0    Est. average glucose Bld gHb Est-mCnc 114 mg/dL     Assessment & Plan  Problem List Items Addressed This Visit      Cardiovascular and Mediastinum   Hypertension - Primary (Chronic)   Relevant Medications   cloNIDine (CATAPRES) 0.1 MG tablet    Other Visit Diagnoses    At risk for decreased bone density        Relevant Orders    DG Bone Density       Meds ordered this encounter  Medications  . cloNIDine (CATAPRES) 0.1 MG tablet    Sig: Take 1 tablet up to twice a day for systolic blood pressures greater than 160.    Dispense:  30 tablet    Refill:  3   1. Essential hypertension Cont Lisinopril bid - cloNIDine (CATAPRES) 0.1 MG tablet; Take 1 tablet up to twice a day for systolic blood pressures greater than 160.  Dispense: 30 tablet; Refill: 3  2. At risk for decreased bone density  - DG Bone Density; Future

## 2016-07-05 ENCOUNTER — Encounter: Payer: Self-pay | Admitting: *Deleted

## 2016-08-07 ENCOUNTER — Ambulatory Visit: Payer: Medicare Other | Admitting: Family Medicine

## 2016-08-15 ENCOUNTER — Ambulatory Visit: Payer: Medicare Other | Admitting: Family Medicine

## 2016-08-15 ENCOUNTER — Encounter: Payer: Medicare Other | Admitting: Family Medicine

## 2016-08-21 ENCOUNTER — Encounter: Payer: Self-pay | Admitting: General Surgery

## 2016-08-22 ENCOUNTER — Encounter: Payer: Self-pay | Admitting: Family Medicine

## 2016-08-22 ENCOUNTER — Ambulatory Visit (INDEPENDENT_AMBULATORY_CARE_PROVIDER_SITE_OTHER): Payer: Medicare Other | Admitting: Family Medicine

## 2016-08-22 VITALS — BP 140/75 | HR 76 | Temp 97.6°F | Ht 66.0 in | Wt 121.0 lb

## 2016-08-22 DIAGNOSIS — Z23 Encounter for immunization: Secondary | ICD-10-CM | POA: Diagnosis not present

## 2016-08-22 DIAGNOSIS — I10 Essential (primary) hypertension: Secondary | ICD-10-CM | POA: Diagnosis not present

## 2016-08-22 DIAGNOSIS — Z0000 Encounter for general adult medical examination without abnormal findings: Secondary | ICD-10-CM | POA: Diagnosis not present

## 2016-08-22 NOTE — Progress Notes (Signed)
Patient: Brittany Oneal, Female    DOB: 1931-01-17, 82 y.o.   MRN: [id number] Visit Date: 08/22/2016  Today's Provider: Dicky Doe, MD   Chief Complaint  Patient presents with  . Medicare Wellness    Subjective:    Annual wellness visit Brittany Oneal is a 82 y.o. female who presents today for her Subsequent Annual Wellness Visit. She feels well. She reports exercising . She reports she is sleeping well.   ----------------------------------------------------------- HPI BPs at home run 0000000 systolic always.  Review of Systems  Social History   Social History  . Marital status: Married    Spouse name: N/A  . Number of children: N/A  . Years of education: N/A   Occupational History  . Not on file.   Social History Main Topics  . Smoking status: Never Smoker  . Smokeless tobacco: Never Used  . Alcohol use No  . Drug use: No  . Sexual activity: Not on file   Other Topics Concern  . Not on file   Social History Narrative  . No narrative on file    Patient Active Problem List   Diagnosis Date Noted  . Hyperglycemia 10/13/2015  . Anorexia 08/03/2015  . Arthritis 08/03/2015  . Cellulitis 08/03/2015  . High potassium 08/03/2015  . Below normal amount of sodium in the blood 08/03/2015  . Cerumen impaction 08/03/2015  . LBP (low back pain) 08/03/2015  . Hypertension 05/07/2015  . Hypothyroidism 05/07/2015  . Infected sebaceous cyst 05/07/2015  . Malignant neoplastic disease ([place]) 12/21/2014  . History of colon cancer 08/21/2013  . Family history of breast cancer 08/21/2013  . Family history of colon cancer 08/21/2013  . Cancer [facility])     Past Surgical History:  Procedure Laterality Date  . ABDOMINAL HYSTERECTOMY  1983  . COLON SURGERY  1990   resection d/t cancer  . COLONOSCOPY  1997,2001,2004,2006, 2011  . EYE SURGERY  Sept 2013  . 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 SALPINGOOPHORECTOMY      Her family history includes Cancer in  her brother, other, and sister; Hypertension in her father and mother.    Previous Medications   CALCIUM CARBONATE (OS-CAL - DOSED IN MG OF ELEMENTAL CALCIUM) 1250 MG TABLET    Take 1 tablet by mouth.   CLONIDINE (CATAPRES) 0.1 MG TABLET    Take 1 tablet up to twice a day for systolic blood pressures greater than 160.   FLUTICASONE (FLONASE) 50 MCG/ACT NASAL SPRAY    Place 1 spray into the nose as needed.   GLUCOSAMINE-CHONDROITIN (GLUCOSAMINE CHONDR COMPLEX) 500-400 MG CAPS    Take 2 capsules by mouth daily.   LEVOTHYROXINE (SYNTHROID, LEVOTHROID) 50 MCG TABLET    TAKE 1 TABLET DAILY   LISINOPRIL (PRINIVIL,ZESTRIL) 40 MG TABLET    TAKE 1 TABLET DAILY   LORATADINE (CLARITIN) 10 MG TABLET    Take 10 mg by mouth daily as needed for allergies.   OMEGA-3 FATTY ACIDS (FISH OIL) 1000 MG CAPS    Take 1 capsule by mouth daily.     Patient Care Team: Arlis Porta., MD as PCP - General (Family Medicine) Seeplaputhur Robinette Haines, MD (General Surgery) Arlis Porta., MD (Family Medicine)     Objective:   Vitals: BP (!) 193/96 (BP Location: Right Arm, Patient Position: Sitting, Cuff Size: Normal)   Pulse 76   Temp 97.6 F (36.4 C) (Oral)   Ht 5\' 6"  (1.676  m)   Wt 54.9 kg (121 lb)   BMI 19.53 kg/m   Physical Exam  Activities of Daily Living In your present state of health, do you have any difficulty performing the following activities: 08/22/2016 01/18/2016  Hearing? N N  Vision? N N  Difficulty concentrating or making decisions? N N  Walking or climbing stairs? N N  Dressing or bathing? N N  Doing errands, shopping? N N  Preparing Food and eating ? N -  Using the Toilet? N -  In the past six months, have you accidently leaked urine? N -  Do you have problems with loss of bowel control? N -  Managing your Medications? N -  Managing your Finances? N -  Housekeeping or managing your Housekeeping? N -  Some recent data might be hidden    Fall Risk Assessment Fall Risk   08/22/2016 04/18/2016 01/18/2016 05/07/2015  Falls in the past year? No No Yes No  Number falls in past yr: - - 1 -  Injury with Fall? - - No -  Follow up - - Follow up appointment -     Patient reports there are safety devices in place in shower at home.   Depression Screen PHQ 2/9 Scores 08/22/2016 04/18/2016 01/18/2016 05/07/2015  PHQ - 2 Score 0 0 0 0     MMSE MMSE - Mini Mental State Exam 08/22/2016  Orientation to time 5  Orientation to Place 5  Registration 3  Attention/ Calculation 5  Recall 3  Language- name 2 objects 2  Language- repeat 1  Language- follow 3 step command 3  Language- read & follow direction 1  Write a sentence 1  Copy design 0  Total score 29     Assessment & Plan:     Annual Wellness Visit  Reviewed patient's Family Medical History Reviewed and updated list of patient's medical providers Assessment of cognitive impairment was done Assessed patient's functional ability Established a written schedule for health screening [place] Completed and Reviewed  Exercise Activities and Dietary recommendations Goals    . Exercise 3x per week (30 min per time)          Patient and her spouse exercise twice a weekly. She says it would be a goal to be able to increase number of days. Her overall goal is to live a healthy life.       Immunization History  Administered Date(s) Administered  . Influenza, High Dose Seasonal PF 08/03/2015  . Influenza-Unspecified 08/03/2015  . Pneumococcal Conjugate-13 10/07/2015  . Pneumococcal Polysaccharide-23 11/27/1994  . Tdap 01/21/2014    Health Maintenance  Topic Date Due  . Samul Dada  01/22/2024  . INFLUENZA VACCINE  Completed  . DEXA SCAN  Completed  . ZOSTAVAX  Completed  . PNA vac Low Risk Adult  Completed     Discussed health benefits of physical activity, and encouraged her to engage in regular exercise appropriate for her age and condition.     ------------------------------------------------------------------------------------------------------------   Problem List Items Addressed This Visit    None    Visit Diagnoses   None.      Brittany Beach, MD [facility]  08/22/2016

## 2016-08-23 ENCOUNTER — Ambulatory Visit: Payer: Medicare Other | Admitting: General Surgery

## 2016-08-28 ENCOUNTER — Encounter: Payer: Self-pay | Admitting: *Deleted

## 2016-09-04 ENCOUNTER — Ambulatory Visit: Payer: Medicare Other | Admitting: General Surgery

## 2016-09-11 ENCOUNTER — Encounter: Payer: Self-pay | Admitting: *Deleted

## 2016-09-14 ENCOUNTER — Ambulatory Visit (INDEPENDENT_AMBULATORY_CARE_PROVIDER_SITE_OTHER): Payer: Medicare Other | Admitting: General Surgery

## 2016-09-14 ENCOUNTER — Encounter: Payer: Self-pay | Admitting: General Surgery

## 2016-09-14 VITALS — BP 126/72 | HR 74 | Resp 12 | Ht 66.0 in | Wt 123.0 lb

## 2016-09-14 DIAGNOSIS — Z85038 Personal history of other malignant neoplasm of large intestine: Secondary | ICD-10-CM | POA: Diagnosis not present

## 2016-09-14 DIAGNOSIS — Z803 Family history of malignant neoplasm of breast: Secondary | ICD-10-CM

## 2016-09-14 NOTE — Progress Notes (Signed)
Patient ID: Brittany Oneal, female   DOB: [birth year], 82 y.o.   MRN: [id number]  Chief Complaint  Patient presents with  . Follow-up    HPI Brittany Oneal is a 82 y.o. female who presents for a breast evaluation and colon cancer follow up. The most recent mammogram was done on 08/14/16.  Patient does perform regular self breast checks and gets regular mammograms done.  No bowel changes. I have reviewed the history of present illness with the patient.   HPI  Past Medical History:  Diagnosis Date  . Arthritis   . Breast screening, unspecified   . Cancer ([place]) 1990   colon  . Family history of malignant neoplasm of breast   . Family history of malignant neoplasm of gastrointestinal tract   . Personal history of malignant neoplasm of large intestine   . Special screening for malignant neoplasms, colon   . Thyroid disease   . Unspecified essential hypertension     Past Surgical History:  Procedure Laterality Date  . ABDOMINAL HYSTERECTOMY  1983  . COLON SURGERY  1990   resection d/t cancer  . COLONOSCOPY  1997,2001,2004,2006, 2011  . EYE SURGERY  Sept 2013  . 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 SALPINGOOPHORECTOMY      Family History  Problem Relation Age of Onset  . Cancer Other     FH of breast and colon cancer  . Cancer Brother     colon  . Cancer Sister     breast  . Hypertension Mother   . Hypertension Father     Social History Social History  Substance Use Topics  . Smoking status: Never Smoker  . Smokeless tobacco: Never Used  . Alcohol use No    Allergies  Allergen Reactions  . Contrast Media [Iodinated Diagnostic Agents] Other (See Comments)    Reaction not listed  . Sulfa Antibiotics Hives  . Shellfish Allergy Rash    Current Outpatient Prescriptions  Medication Sig Dispense Refill  . calcium carbonate (OS-CAL - DOSED IN MG OF ELEMENTAL CALCIUM) 1250 MG tablet Take 1 tablet by mouth.    . cloNIDine (CATAPRES) 0.1 MG tablet Take  1 tablet up to twice a day for systolic blood pressures greater than 160. 30 tablet 3  . fluticasone (FLONASE) 50 MCG/ACT nasal spray Place 1 spray into the nose as needed.    . Glucosamine-Chondroitin (GLUCOSAMINE CHONDR COMPLEX) 500-400 MG CAPS Take 2 capsules by mouth daily.    Marland Kitchen levothyroxine (SYNTHROID, LEVOTHROID) 50 MCG tablet TAKE 1 TABLET DAILY 90 tablet 3  . lisinopril (PRINIVIL,ZESTRIL) 40 MG tablet TAKE 1 TABLET DAILY (Patient taking differently: Takes 1/2 tablet  twice a day) 90 tablet 3  . loratadine (CLARITIN) 10 MG tablet Take 10 mg by mouth daily as needed for allergies.    . Omega-3 Fatty Acids (FISH OIL) 1000 MG CAPS Take 1 capsule by mouth daily.      No current facility-administered medications for this visit.     Review of Systems Review of Systems  Blood pressure 126/72, pulse 74, resp. rate 12, height 5\' 6"  (1.676 m), weight 123 lb (55.8 kg).  Physical Exam Physical Exam  Constitutional: She is oriented to person, place, and time. She appears well-developed and well-nourished.  Eyes: Conjunctivae are normal. No scleral icterus.  Neck: Neck supple.  Cardiovascular: Normal rate, regular rhythm and normal heart sounds.   Pulmonary/Chest: Effort normal and breath sounds normal. Right breast exhibits  no inverted nipple, no mass, no nipple discharge, no skin change and no tenderness. Left breast exhibits no inverted nipple, no mass, no nipple discharge, no skin change and no tenderness.  Abdominal: Soft. Bowel sounds are normal. There is no tenderness.  Lymphadenopathy:    She has no cervical adenopathy.    She has no axillary adenopathy.  Neurological: She is alert and oriented to person, place, and time.  Skin: Skin is warm and dry.    Data Reviewed Mammogram reviewed   Assessment   History of colon cancer. Family history of breast cancer. Stable breast exam.    Plan     The patient has been asked to return to the office in one year with a bilateral  screening mammogram.   Last colonoscopy was done in 2011. Patient will decide if she would like to proceed with a colonoscopy and notify the office if she does.      This information has been scribed by Gaspar Cola CMA.   SANKAR,SEEPLAPUTHUR G 09/14/2016, 10:43 AM

## 2016-11-28 ENCOUNTER — Ambulatory Visit (INDEPENDENT_AMBULATORY_CARE_PROVIDER_SITE_OTHER): Payer: Medicare Other | Admitting: Family Medicine

## 2016-11-28 ENCOUNTER — Encounter: Payer: Self-pay | Admitting: Family Medicine

## 2016-11-28 VITALS — BP 150/80 | HR 86 | Temp 97.5°F | Resp 16 | Ht 66.0 in | Wt 126.0 lb

## 2016-11-28 DIAGNOSIS — M545 Low back pain, unspecified: Secondary | ICD-10-CM

## 2016-11-28 DIAGNOSIS — I10 Essential (primary) hypertension: Secondary | ICD-10-CM

## 2016-11-28 MED ORDER — MELOXICAM 7.5 MG PO TABS: 7.5000 mg | ORAL_TABLET | Freq: Every day | ORAL | 1 refills | Status: DC

## 2016-11-28 MED ORDER — CYCLOBENZAPRINE HCL 5 MG PO TABS: 5.0000 mg | ORAL_TABLET | Freq: Three times a day (TID) | ORAL | 1 refills | Status: DC | PRN

## 2016-11-28 NOTE — Patient Instructions (Signed)
Alternate ice and heat to low back area 3-=4 times a day.

## 2016-11-28 NOTE — Progress Notes (Signed)
Name: Brittany Oneal   MRN: [id number]    DOB: [birth year]   Date:11/28/2016       Progress Note  Subjective  Chief Complaint  Chief Complaint  Patient presents with  . Back Pain    HPI Here c/o lower bilateral back pain for past 4-5 days.  Started with bending over one morning.  Getting from sitting to standing is most painful.  No radiation of pain to abd or down legs.  No other urinary c/o. No problem-specific Assessment & Plan notes found for this encounter.   Past Medical History:  Diagnosis Date  . Arthritis   . Breast screening, unspecified   . Cancer ([place]) 1990   colon  . Family history of malignant neoplasm of breast   . Family history of malignant neoplasm of gastrointestinal tract   . Personal history of malignant neoplasm of large intestine   . Special screening for malignant neoplasms, colon   . Thyroid disease   . Unspecified essential hypertension     Social History  Substance Use Topics  . Smoking status: Never Smoker  . Smokeless tobacco: Never Used  . Alcohol use No     Current Outpatient Prescriptions:  .  calcium carbonate (OS-CAL - DOSED IN MG OF ELEMENTAL CALCIUM) 1250 MG tablet, Take 1 tablet by mouth., Disp: , Rfl:  .  cloNIDine (CATAPRES) 0.1 MG tablet, Take 1 tablet up to twice a day for systolic blood pressures greater than 160., Disp: 30 tablet, Rfl: 3 .  fluticasone (FLONASE) 50 MCG/ACT nasal spray, Place 1 spray into the nose as needed., Disp: , Rfl:  .  Glucosamine-Chondroitin (GLUCOSAMINE CHONDR COMPLEX) 500-400 MG CAPS, Take 2 capsules by mouth daily., Disp: , Rfl:  .  levothyroxine (SYNTHROID, LEVOTHROID) 50 MCG tablet, TAKE 1 TABLET DAILY, Disp: 90 tablet, Rfl: 3 .  lisinopril (PRINIVIL,ZESTRIL) 40 MG tablet, TAKE 1 TABLET DAILY (Patient taking differently: Takes 1/2 tablet  twice a day), Disp: 90 tablet, Rfl: 3 .  loratadine (CLARITIN) 10 MG tablet, Take 10 mg by mouth daily as needed for allergies., Disp: , Rfl:  .  Omega-3 Fatty Acids  (FISH OIL) 1000 MG CAPS, Take 1 capsule by mouth daily. , Disp: , Rfl:   Allergies  Allergen Reactions  . Contrast Media [Iodinated Diagnostic Agents] Other (See Comments)    Reaction not listed  . Sulfa Antibiotics Hives  . Shellfish Allergy Rash    Review of Systems  Constitutional: Negative for chills, fever, malaise/fatigue and weight loss.  HENT: Negative for hearing loss and tinnitus.   Eyes: Negative for blurred vision and double vision.  Respiratory: Negative for hemoptysis, shortness of breath and wheezing.   Cardiovascular: Negative for chest pain, palpitations and leg swelling.  Gastrointestinal: Negative for abdominal pain, blood in stool, constipation, diarrhea, heartburn, nausea and vomiting.  Genitourinary: Negative for dysuria and urgency.  Musculoskeletal: Positive for back pain.  Neurological: Negative for dizziness, tingling, tremors, weakness and headaches.      Objective  Vitals:   11/28/16 1442  BP: (!) 178/92  Pulse: 86  Resp: 16  Temp: 97.5 F (36.4 C)  TempSrc: Oral  Weight: 126 lb (57.2 kg)  Height: 5\' 6"  (1.676 m)     Physical Exam  Constitutional: She is oriented to person, place, and time and well-developed, well-nourished, and in no distress. No distress.  HENT:  Head: Normocephalic and atraumatic.  Cardiovascular: Normal rate, regular rhythm and normal heart sounds.  Exam reveals no gallop and no friction  rub.   No murmur heard. Pulmonary/Chest: Effort normal and breath sounds normal. No respiratory distress. She has no wheezes. She has no rales.  No CVA tenderness  Abdominal: Soft. Bowel sounds are normal. She exhibits no distension and no mass. There is no tenderness.  Musculoskeletal:  Mild to moderate tenderness over bilateral SI joints, esp with ROM of lower back.  No neuro radiation.  Neurological: She is alert and oriented to person, place, and time. She has normal reflexes. No cranial nerve deficit. Gait normal.  Vitals  reviewed.     No results found for this or any previous visit (from the past 2160 hour(s)).   Assessment & Plan  1. Acute bilateral low back pain without sciatica  - meloxicam (MOBIC) 7.5 MG tablet; Take 1 tablet (7.5 mg total) by mouth daily.  Dispense: 30 tablet; Refill: 1 - cyclobenzaprine (FLEXERIL) 5 MG tablet; Take 1 tablet (5 mg total) by mouth 3 (three) times daily as needed for muscle spasms.  Dispense: 20 tablet; Refill: 1  2. Essential hypertension

## 2016-12-26 ENCOUNTER — Encounter: Payer: Self-pay | Admitting: *Deleted

## 2016-12-26 ENCOUNTER — Encounter: Payer: Self-pay | Admitting: Family Medicine

## 2016-12-26 ENCOUNTER — Ambulatory Visit (INDEPENDENT_AMBULATORY_CARE_PROVIDER_SITE_OTHER): Payer: Medicare Other | Admitting: Family Medicine

## 2016-12-26 VITALS — BP 130/70 | HR 75 | Temp 97.7°F | Resp 16 | Ht 66.0 in | Wt 123.0 lb

## 2016-12-26 DIAGNOSIS — E039 Hypothyroidism, unspecified: Secondary | ICD-10-CM | POA: Diagnosis not present

## 2016-12-26 DIAGNOSIS — M545 Low back pain, unspecified: Secondary | ICD-10-CM

## 2016-12-26 DIAGNOSIS — I10 Essential (primary) hypertension: Secondary | ICD-10-CM

## 2016-12-26 DIAGNOSIS — M069 Rheumatoid arthritis, unspecified: Secondary | ICD-10-CM

## 2016-12-26 DIAGNOSIS — Z85038 Personal history of other malignant neoplasm of large intestine: Secondary | ICD-10-CM

## 2016-12-26 DIAGNOSIS — R739 Hyperglycemia, unspecified: Secondary | ICD-10-CM

## 2016-12-26 MED ORDER — MELOXICAM 7.5 MG PO TABS: 7.5000 mg | ORAL_TABLET | Freq: Every day | ORAL | 1 refills | Status: DC

## 2016-12-26 NOTE — Progress Notes (Signed)
Name: Brittany Oneal   MRN: [id number]    DOB: 1931/06/18   Date:12/26/2016       Progress Note  Subjective  Chief Complaint  Chief Complaint  Patient presents with  . Hypertension    HPI Here for f/u of HBP.  Patient reports that she has not b en checking B Ps recently, but it is always lower at home than here in office.  Overall she is feeling well.    No problem-specific Assessment & Plan notes found for this encounter.   Past Medical History:  Diagnosis Date  . Arthritis   . Breast screening, unspecified   . Cancer ([place]) 1990   colon  . Family history of malignant neoplasm of breast   . Family history of malignant neoplasm of gastrointestinal tract   . Personal history of malignant neoplasm of large intestine   . Special screening for malignant neoplasms, colon   . Thyroid disease   . Unspecified essential hypertension     Past Surgical History:  Procedure Laterality Date  . ABDOMINAL HYSTERECTOMY  1983  . COLON SURGERY  1990   resection d/t cancer  . COLONOSCOPY  1997,2001,2004,2006, 2011  . EYE SURGERY  Sept 2013  . 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 SALPINGOOPHORECTOMY      Family History  Problem Relation Age of Onset  . Hypertension Mother   . Hypertension Father   . Cancer Other     FH of breast and colon cancer  . Cancer Brother     colon  . Cancer Sister     breast    Social History   Social History  . Marital status: Married    Spouse name: N/A  . Number of children: N/A  . Years of education: N/A   Occupational History  . Not on file.   Social History Main Topics  . Smoking status: Never Smoker  . Smokeless tobacco: Never Used  . Alcohol use No  . Drug use: No  . Sexual activity: Not on file   Other Topics Concern  . Not on file   Social History Narrative  . No narrative on file     Current Outpatient Prescriptions:  .  calcium carbonate (OS-CAL - DOSED IN MG OF ELEMENTAL CALCIUM) 1250 MG tablet, Take 1  tablet by mouth., Disp: , Rfl:  .  fluticasone (FLONASE) 50 MCG/ACT nasal spray, Place 1 spray into the nose as needed., Disp: , Rfl:  .  Glucosamine-Chondroitin (GLUCOSAMINE CHONDR COMPLEX) 500-400 MG CAPS, Take 2 capsules by mouth daily., Disp: , Rfl:  .  levothyroxine (SYNTHROID, LEVOTHROID) 50 MCG tablet, TAKE 1 TABLET DAILY, Disp: 90 tablet, Rfl: 3 .  lisinopril (PRINIVIL,ZESTRIL) 40 MG tablet, TAKE 1 TABLET DAILY (Patient taking differently: Takes 1/2 tablet  twice a day), Disp: 90 tablet, Rfl: 3 .  loratadine (CLARITIN) 10 MG tablet, Take 10 mg by mouth daily as needed for allergies., Disp: , Rfl:  .  meloxicam (MOBIC) 7.5 MG tablet, Take 1 tablet (7.5 mg total) by mouth daily., Disp: 30 tablet, Rfl: 1 .  Omega-3 Fatty Acids (FISH OIL) 1000 MG CAPS, Take 1 capsule by mouth daily. , Disp: , Rfl:   Allergies  Allergen Reactions  . Contrast Media [Iodinated Diagnostic Agents] Other (See Comments)    Reaction not listed  . Sulfa Antibiotics Hives  . Shellfish Allergy Rash     Review of Systems  Constitutional: Negative for chills, fever, malaise/fatigue  and weight loss.  HENT: Negative for hearing loss and tinnitus.   Eyes: Negative for blurred vision and double vision.  Respiratory: Negative for cough, shortness of breath and wheezing.   Cardiovascular: Negative for chest pain, palpitations and leg swelling.  Gastrointestinal: Negative for abdominal pain, blood in stool and heartburn.  Genitourinary: Negative for dysuria, frequency and urgency.  Musculoskeletal: Positive for joint pain (fingers). Negative for myalgias.  Skin: Negative for rash.  Neurological: Negative for dizziness, tingling, tremors, weakness and headaches.      Objective  Vitals:   12/26/16 1110 12/26/16 1159  BP: (!) 162/78 130/70  Pulse: 75   Resp: 16   Temp: 97.7 F (36.5 C)   TempSrc: Oral   Weight: 123 lb (55.8 kg)   Height: 5\' 6"  (1.676 m)     Physical Exam  Constitutional: She is oriented  to person, place, and time and well-developed, well-nourished, and in no distress. No distress.  HENT:  Head: Normocephalic and atraumatic.  Eyes: Conjunctivae and EOM are normal. Pupils are equal, round, and reactive to light. No scleral icterus.  Neck: Normal range of motion. Neck supple. Carotid bruit is not present. No thyromegaly present.  Cardiovascular: Normal rate, regular rhythm and normal heart sounds.  Exam reveals no gallop and no friction rub.   No murmur heard. Pulmonary/Chest: Effort normal and breath sounds normal. No respiratory distress. She has no wheezes. She has no rales.  Musculoskeletal: She exhibits no edema.  Lymphadenopathy:    She has no cervical adenopathy.  Neurological: She is alert and oriented to person, place, and time.  Vitals reviewed.      No results found for this or any previous visit (from the past 2160 hour(s)).   Assessment & Plan  Problem List Items Addressed This Visit      Cardiovascular and Mediastinum   Hypertension - Primary (Chronic)   Relevant Orders   COMPLETE METABOLIC PANEL WITH GFR   Lipid Profile     Endocrine   Hypothyroidism (Chronic)   Relevant Orders   TSH     Other   History of colon cancer   Relevant Orders   CBC with Differential   Low back pain   Relevant Medications   meloxicam (MOBIC) 7.5 MG tablet   Hyperglycemia   Relevant Orders   HgB A1c    Other Visit Diagnoses    Rheumatoid arthritis involving both hands, unspecified rheumatoid factor presence (HCC)       Relevant Medications   meloxicam (MOBIC) 7.5 MG tablet      Meds ordered this encounter  Medications  . meloxicam (MOBIC) 7.5 MG tablet    Sig: Take 1 tablet (7.5 mg total) by mouth daily.    Dispense:  30 tablet    Refill:  1   1. Essential hypertension Cont Lisinopril 40 mg., 1/2 tablet twice a day. - COMPLETE METABOLIC PANEL WITH GFR - Lipid Profile  2. Hypothyroidism, unspecified type  - TSH  3. Hyperglycemia  - HgB  A1c  4. History of colon cancer  - CBC with Differential  5. Acute bilateral low back pain without sciatica  - meloxicam (MOBIC) 7.5 MG tablet; Take 1 tablet (7.5 mg total) by mouth daily.  Dispense: 30 tablet; Refill: 1

## 2017-01-02 ENCOUNTER — Other Ambulatory Visit: Payer: Medicare Other

## 2017-01-03 LAB — LIPID PANEL
CHOL/HDL RATIO: 2.3 ratio (ref <5.0)
CHOLESTEROL: 160 mg/dL (ref <200)
HDL: 71 mg/dL (ref >50)
LDL Cholesterol: 76 mg/dL (ref <100)
Triglycerides: 65 mg/dL (ref <150)
VLDL: 13 mg/dL (ref <30)

## 2017-01-03 LAB — CBC WITH DIFFERENTIAL/PLATELET
BASOS PCT: 1 %
Basophils Absolute: 42 cells/uL (ref 0–200)
EOS ABS: 210 {cells}/uL (ref 15–500)
EOS PCT: 5 %
HCT: 38.9 % (ref 35.0–45.0)
Hemoglobin: 13.1 g/dL (ref 11.7–15.5)
Lymphocytes Relative: 42 %
Lymphs Abs: 1764 cells/uL (ref 850–3900)
MCH: 29.4 pg (ref 27.0–33.0)
MCHC: 33.7 g/dL (ref 32.0–36.0)
MCV: 87.4 fL (ref 80.0–100.0)
MONOS PCT: 12 %
MPV: 9.7 fL (ref 7.5–12.5)
Monocytes Absolute: 504 cells/uL (ref 200–950)
NEUTROS ABS: 1680 {cells}/uL (ref 1500–7800)
Neutrophils Relative %: 40 %
PLATELETS: 178 10*3/uL (ref 140–400)
RBC: 4.45 MIL/uL (ref 3.80–5.10)
RDW: 13.8 % (ref 11.0–15.0)
WBC: 4.2 10*3/uL (ref 3.8–10.8)

## 2017-01-03 LAB — COMPLETE METABOLIC PANEL WITH GFR
ALT: 9 U/L (ref 6–29)
AST: 17 U/L (ref 10–35)
Albumin: 4.1 g/dL (ref 3.6–5.1)
Alkaline Phosphatase: 49 U/L (ref 33–130)
BILIRUBIN TOTAL: 0.6 mg/dL (ref 0.2–1.2)
BUN: 17 mg/dL (ref 7–25)
CO2: 28 mmol/L (ref 20–31)
Calcium: 9 mg/dL (ref 8.6–10.4)
Chloride: 100 mmol/L (ref 98–110)
Creat: 0.8 mg/dL (ref 0.60–0.88)
GFR, EST AFRICAN AMERICAN: 79 mL/min (ref >=60)
GFR, EST NON AFRICAN AMERICAN: 68 mL/min (ref >=60)
Glucose, Bld: 80 mg/dL (ref 65–99)
POTASSIUM: 4.2 mmol/L (ref 3.5–5.3)
SODIUM: 139 mmol/L (ref 135–146)
TOTAL PROTEIN: 6.2 g/dL (ref 6.1–8.1)

## 2017-01-03 LAB — TSH: TSH: 1.72 mIU/L

## 2017-01-03 LAB — HEMOGLOBIN A1C
Hgb A1c MFr Bld: 5.2 % (ref <5.7)
Mean Plasma Glucose: 103 mg/dL

## 2017-04-02 ENCOUNTER — Other Ambulatory Visit: Payer: Self-pay

## 2017-04-02 DIAGNOSIS — I10 Essential (primary) hypertension: Secondary | ICD-10-CM

## 2017-04-02 DIAGNOSIS — E039 Hypothyroidism, unspecified: Secondary | ICD-10-CM

## 2017-04-02 MED ORDER — LISINOPRIL 40 MG PO TABS: 20.0000 mg | ORAL_TABLET | Freq: Two times a day (BID) | ORAL | 3 refills | Status: DC

## 2017-04-02 MED ORDER — LEVOTHYROXINE SODIUM 50 MCG PO TABS: 50.0000 ug | ORAL_TABLET | Freq: Every day | ORAL | 3 refills | Status: DC

## 2017-04-02 NOTE — Telephone Encounter (Signed)
Last ov  12/26/16 Last filled 04/06/16. Next appointment on 04/27/2017

## 2017-04-27 ENCOUNTER — Ambulatory Visit: Payer: Medicare Other | Admitting: Family Medicine

## 2017-04-30 ENCOUNTER — Ambulatory Visit (INDEPENDENT_AMBULATORY_CARE_PROVIDER_SITE_OTHER): Payer: Medicare Other | Admitting: Family Medicine

## 2017-04-30 ENCOUNTER — Encounter: Payer: Self-pay | Admitting: Family Medicine

## 2017-04-30 VITALS — BP 134/86 | HR 72 | Temp 98.5°F | Resp 16 | Ht 66.0 in | Wt 126.0 lb

## 2017-04-30 DIAGNOSIS — R7303 Prediabetes: Secondary | ICD-10-CM | POA: Diagnosis not present

## 2017-04-30 DIAGNOSIS — Z85038 Personal history of other malignant neoplasm of large intestine: Secondary | ICD-10-CM | POA: Diagnosis not present

## 2017-04-30 DIAGNOSIS — I10 Essential (primary) hypertension: Secondary | ICD-10-CM | POA: Diagnosis not present

## 2017-04-30 DIAGNOSIS — E7800 Pure hypercholesterolemia, unspecified: Secondary | ICD-10-CM

## 2017-04-30 MED ORDER — ASPIRIN EC 81 MG PO TBEC: 81.0000 mg | DELAYED_RELEASE_TABLET | Freq: Every day | ORAL | Status: AC

## 2017-04-30 NOTE — Assessment & Plan Note (Signed)
Initial significantly elevated initial BP, repeat manual check improved to normal range, suspect inaccurate initial reading. Known history of labile BP or difficult to control despite home readings normal. Also outside nurse home BP check 128/80 recently. No known complications Concern with history of hypotension vs syncope in 83 yr old patient    Plan:  1. Continue current BP regimen - Lisinopril 40mg  - HALF tab BID (20mg  per dose) 2. Encourage improved lifestyle - continue low sodium diet, regular exercise 3. Restart monitor BP outside office 1-2x weekly, bring readings to next visit, notify office if persistently >140/90 4. Follow-up 3 months for HTN only - future annual phys + labs 12/2017

## 2017-04-30 NOTE — Patient Instructions (Addendum)
Thank you for coming to the clinic today.  1. BP was initially elevated, I think this was a false reading - Repeat improved  Keep checking BP 1-2x weekly for now - write down numbers, if persistently >140/90, let us know and we can re-adjust med  Continue Lisinopril half tab in AM and half tab in PM  No changes today  2. START baby Aspirin 81mg  every day for reducing future cardiovascular risk (heart attack and stroke) - Also as discussed, you would meet criteria to start Statin cholesterol medication to further reduce risk - I would recommend Rosuvastatin (Crestor) 5mg  we would start once weekly, then gradually increase to 2x weekly, then 3x weekly, maybe max dose every other day, or possibly every day if tolerating well  Please schedule a Follow-up Appointment to: Return in about 3 months (around 07/31/2017) for blood pressure.   Next visit will be in 6 months after 1st week of February 2019  If you have any other questions or concerns, please feel free to call the clinic or send a message through [facility]. You may also schedule an earlier appointment if necessary.  [name], DO [place]

## 2017-04-30 NOTE — Assessment & Plan Note (Signed)
Well-controlled Pre-DM with A1c 5.2 last in 12/2016, prior only elevated to 5.7 at peak 2 years ago Concern with HTN  Plan:  1. Not on any therapy currently  2. Encourage improved lifestyle - low carb, low sugar diet - given BMI 20 and prior wt loss, encouraged to continue higher protein diet, should not limit calories based on concern for Pre-DM. Continue regular exercise 3. Follow-up q 6-12 mo, only check A1c yearly

## 2017-04-30 NOTE — Assessment & Plan Note (Signed)
Controlled cholesterol on lifestyle Last lipid panel 12/2016, improved, had previously mild elevated LDL, but very good HDL Calculated ASCVD 10 yr risk score >30%  Plan: 1. Continue current meds - Fish Oil daily 2. Discussion on future ASCVD risk and prevention, no prior CAD/MI/CVA - Start new ASA 81mg  for primary ASCVD risk reduction. Also offered statin therapy - reviewed this and possible starting dose intermittent Rosuvastatin, but mutual decision agree to hold off for now and can add later 3. Encourage improved lifestyle - continue current diet and exercise 4. Follow-up q 12 months, next due annual physical 12/2016 will order labs in future

## 2017-04-30 NOTE — Assessment & Plan Note (Signed)
Updated diagnosis list, history of colon cancer sigmoid, s/p resection in 1990 Stable without recurrence Last colonoscopy 2011 (prior 2006, 2004 and prior) Followed by General Surgery Dr Jamal Collin

## 2017-04-30 NOTE — Progress Notes (Addendum)
Subjective:    Patient ID: Brittany Oneal, female    DOB: Jun 23, 1931, 83 y.o.   MRN: [id number]  REMELL Oneal is a 83 y.o. female presenting on 04/30/2017 for Hypertension   HPI   CHRONIC HTN: Reports in past has had history of abnormal BP at doctors office but normal readings at home. Also she participates in [employer] calls, recently had home BP reading 128/80 by nurse few days ago, has copy of this report. Current Meds - Lisinopril 50m - takes HALF dose 237min AM and 2028mn PM   Reports good compliance, took meds today. Tolerating well, w/o complaints. Denies CP, dyspnea, HA, edema, dizziness / lightheadedness  Pre-Diabetes: Reports new diagnosis about 2 years ago with Pre-Diabetes A1c 5.7. She has had improved A1c since,  Last 5.2 (12/2016) Meds: Never on medication for blood sugar Currently on ACEi Lifestyle: - Diet (Tries to eat healthier, does not eat dessert regularly, some smaller portion sizes, had added extra protein due to some prior wt loss, drinks mostly water, some tea, coffee)  - Exercise (Regular exercises at gym 2x weekly, works with trainer for weight machines and cardio treadmill, stationary bicycle) - Family history of DM2 (Father) Denies hypoglycemia, polyuria, visual changes, numbness or tingling  Mild Elevated LDL - Reports no concerns. Last lipid panel 12/2016, controlled but prior lipids 01/2016 with mild elevated LDL, she was unaware of this result - Currently taking Fish Oil omega 3 1g daily - No known history of CAD, MI, CVA - Not taking ASA. Never on prior STatin  Health Maintenance: - Screening Osteoporosis: Last DEXA 05/04/16 - normal T score result, prior DEXA also normal, no history of osteopenia or osteoporosis. No known fractures. Taking daily Calcium and Vitamin D supplement - History of colon cancer 1990 (sigmoid resection)  Social History  Substance Use Topics  . Smoking status: Never Smoker  . Smokeless tobacco: Never Used  . Alcohol use No     Review of Systems Per HPI unless specifically indicated above     Objective:    BP 134/86 (BP Location: Left Arm, Cuff Size: Normal)   Pulse 72   Temp 98.5 F (36.9 C) (Oral)   Resp 16   Ht 5' 6"  (1.676 m)   Wt 126 lb (57.2 kg)   BMI 20.34 kg/m   Wt Readings from Last 3 Encounters:  04/30/17 126 lb (57.2 kg)  12/26/16 123 lb (55.8 kg)  11/28/16 126 lb (57.2 kg)    Physical Exam  Constitutional: She is oriented to person, place, and time. She appears well-developed and well-nourished. No distress.  Well-appearing elderly 85 2ar old female, comfortable, cooperative  HENT:  Head: Normocephalic and atraumatic.  Mouth/Throat: Oropharynx is clear and moist.  Eyes: Conjunctivae and EOM are normal. Pupils are equal, round, and reactive to light. Right eye exhibits no discharge. Left eye exhibits no discharge.  Neck: Normal range of motion. Neck supple. No thyromegaly present.  Cardiovascular: Normal rate, regular rhythm, normal heart sounds and intact distal pulses.   No murmur heard. Pulmonary/Chest: Effort normal and breath sounds normal. No respiratory distress. She has no wheezes.  Lymphadenopathy:    She has no cervical adenopathy.  Neurological: She is alert and oriented to person, place, and time. No cranial nerve deficit.  Chronic left facial droop  Skin: Skin is warm and dry. No rash noted. She is not diaphoretic. No erythema.  Psychiatric: She has a normal mood and affect. Her behavior is normal.  Well groomed, good eye contact, normal speech and thoughts  Nursing note and vitals reviewed.  [facility] Screening Results:  Carotid Artery Screen - Doppler US Left PSV 11-125cm/s, Right PSV 11-125cm/s - Bilateral Mild Disease  EKG without evidence of Atrial Fibrillation (No EKG images available for review)  Abdominal Aortic Aneurysm Screening - < 3 cm on ultrasound  Peripheral Arterial Disease Screening - ABI L 1.15, R 1.12 - Normal  Results for orders placed or  performed in visit on 12/26/16  COMPLETE METABOLIC PANEL WITH GFR  Result Value Ref Range   Sodium 139 135 - 146 mmol/L   Potassium 4.2 3.5 - 5.3 mmol/L   Chloride 100 98 - 110 mmol/L   CO2 28 20 - 31 mmol/L   Glucose, Bld 80 65 - 99 mg/dL   BUN 17 7 - 25 mg/dL   Creat 0.80 0.60 - 0.88 mg/dL   Total Bilirubin 0.6 0.2 - 1.2 mg/dL   Alkaline Phosphatase 49 33 - 130 U/L   AST 17 10 - 35 U/L   ALT 9 6 - 29 U/L   Total Protein 6.2 6.1 - 8.1 g/dL   Albumin 4.1 3.6 - 5.1 g/dL   Calcium 9.0 8.6 - 10.4 mg/dL   GFR, Est African American 79 >=60 mL/min   GFR, Est Non African American 68 >=60 mL/min  CBC with Differential  Result Value Ref Range   WBC 4.2 3.8 - 10.8 K/uL   RBC 4.45 3.80 - 5.10 MIL/uL   Hemoglobin 13.1 11.7 - 15.5 g/dL   HCT 38.9 35.0 - 45.0 %   MCV 87.4 80.0 - 100.0 fL   MCH 29.4 27.0 - 33.0 pg   MCHC 33.7 32.0 - 36.0 g/dL   RDW 13.8 11.0 - 15.0 %   Platelets 178 140 - 400 K/uL   MPV 9.7 7.5 - 12.5 fL   Neutro Abs 1,680 1,500 - 7,800 cells/uL   Lymphs Abs 1,764 850 - 3,900 cells/uL   Monocytes Absolute 504 200 - 950 cells/uL   Eosinophils Absolute 210 15 - 500 cells/uL   Basophils Absolute 42 0 - 200 cells/uL   Neutrophils Relative % 40 %   Lymphocytes Relative 42 %   Monocytes Relative 12 %   Eosinophils Relative 5 %   Basophils Relative 1 %   Smear Review Criteria for review not met   Lipid Profile  Result Value Ref Range   Cholesterol 160 <200 mg/dL   Triglycerides 65 <150 mg/dL   HDL 71 >50 mg/dL   Total CHOL/HDL Ratio 2.3 <5.0 Ratio   VLDL 13 <30 mg/dL   LDL Cholesterol 76 <100 mg/dL  TSH  Result Value Ref Range   TSH 1.72 mIU/L  HgB A1c  Result Value Ref Range   Hgb A1c MFr Bld 5.2 <5.7 %   Mean Plasma Glucose 103 mg/dL      Assessment & Plan:   Problem List Items Addressed This Visit    Pre-diabetes    Well-controlled Pre-DM with A1c 5.2 last in 12/2016, prior only elevated to 5.7 at peak 2 years ago Concern with HTN  Plan:  1. Not on any  therapy currently  2. Encourage improved lifestyle - low carb, low sugar diet - given BMI 20 and prior wt loss, encouraged to continue higher protein diet, should not limit calories based on concern for Pre-DM. Continue regular exercise 3. Follow-up q 6-12 mo, only check A1c yearly      Hypertension - Primary (Chronic)  Initial significantly elevated initial BP, repeat manual check improved to normal range, suspect inaccurate initial reading. Known history of labile BP or difficult to control despite home readings normal. Also outside nurse home BP check 128/80 recently. No known complications Concern with history of hypotension vs syncope in 83 yr old patient    Plan:  1. Continue current BP regimen - Lisinopril 74m - HALF tab BID (222mper dose) 2. Encourage improved lifestyle - continue low sodium diet, regular exercise 3. Restart monitor BP outside office 1-2x weekly, bring readings to next visit, notify office if persistently >140/90 4. Follow-up 3 months for HTN only - future annual phys + labs 12/2017      Relevant Medications   aspirin EC 81 MG tablet   History of colon cancer    Updated diagnosis list, history of colon cancer sigmoid, s/p resection in 1990 Stable without recurrence Last colonoscopy 2011 (prior 2006, 2004 and prior) Followed by General Surgery Dr SaJamal Collin    Elevated LDL cholesterol level    Controlled cholesterol on lifestyle Last lipid panel 12/2016, improved, had previously mild elevated LDL, but very good HDL Calculated ASCVD 10 yr risk score >30%  Plan: 1. Continue current meds - Fish Oil daily 2. Discussion on future ASCVD risk and prevention, no prior CAD/MI/CVA - Start new ASA 8180mor primary ASCVD risk reduction. Also offered statin therapy - reviewed this and possible starting dose intermittent Rosuvastatin, but mutual decision agree to hold off for now and can add later 3. Encourage improved lifestyle - continue current diet and exercise 4.  Follow-up q 12 months, next due annual physical 12/2016 will order labs in future         Meds ordered this encounter  Medications  . aspirin EC 81 MG tablet    Sig: Take 1 tablet (81 mg total) by mouth daily.    Follow up plan: Return in about 3 months (around 07/31/2017) for blood pressure.  AleNobie PutnamO Fergus Fallsoup 04/30/2017, 12:30 PM

## 2017-06-12 ENCOUNTER — Telehealth: Payer: Self-pay | Admitting: Family Medicine

## 2017-06-12 NOTE — Telephone Encounter (Signed)
Called pt to schedule Annual Wellness Visit with NHA  - knb  °

## 2017-06-26 ENCOUNTER — Telehealth: Payer: Self-pay | Admitting: Family Medicine

## 2017-06-26 NOTE — Telephone Encounter (Signed)
Called pt to schedule for Annual Wellness Visit with Nurse Health Advisor, Tiffany Hill, my c/b # is [phone]  Kathryn Brown ° °

## 2017-07-17 ENCOUNTER — Ambulatory Visit (INDEPENDENT_AMBULATORY_CARE_PROVIDER_SITE_OTHER): Payer: Medicare Other

## 2017-07-17 VITALS — BP 158/78 | HR 75 | Temp 97.9°F | Resp 16 | Ht 66.0 in | Wt 125.4 lb

## 2017-07-17 DIAGNOSIS — Z0000 Encounter for general adult medical examination without abnormal findings: Secondary | ICD-10-CM

## 2017-07-17 NOTE — Patient Instructions (Addendum)
Brittany Oneal , Thank you for taking time to come for your Medicare Wellness Visit. I appreciate your ongoing commitment to your health goals. Please review the following plan we discussed and let me know if I can assist you in the future.   Screening recommendations/referrals: Colonoscopy: completed 07/28/2014, no longer required Mammogram: completed 08/18/2016, no longer required Bone Density: completed 05/04/2016  Recommended yearly ophthalmology/optometry visit for glaucoma screening and checkup Recommended yearly dental visit for hygiene and checkup  Vaccinations: Influenza vaccine: up to date, due 07/2017 Pneumococcal vaccine: up to date Tdap vaccine: up to date Shingles vaccine: due, check with your insurance company for coverage  Advanced directives: Please bring a copy of your health care power of attorney and living will to the office at your convenience.  Conditions/risks identified: Recommend drinking at least 4-5 glasses of water a day   Next appointment: Follow up on 07/31/2017 at 11:00am with Dr.Karamalegos. Follow up in one year for your annual wellness exam.    Preventive Care 81 Years and Older, Female Preventive care refers to lifestyle choices and visits with your health care provider that can promote health and wellness. What does preventive care include?  A yearly physical exam. This is also called an annual well check.  Dental exams once or twice a year.  Routine eye exams. Ask your health care provider how often you should have your eyes checked.  Personal lifestyle choices, including:  Daily care of your teeth and gums.  Regular physical activity.  Eating a healthy diet.  Avoiding tobacco and drug use.  Limiting alcohol use.  Practicing safe sex.  Taking low-dose aspirin every day.  Taking vitamin and mineral supplements as recommended by your health care provider. What happens during an annual well check? The services and screenings done by your  health care provider during your annual well check will depend on your age, overall health, lifestyle risk factors, and family history of disease. Counseling  Your health care provider may ask you questions about your:  Alcohol use.  Tobacco use.  Drug use.  Emotional well-being.  Home and relationship well-being.  Sexual activity.  Eating habits.  History of falls.  Memory and ability to understand (cognition).  Work and work Statistician.  Reproductive health. Screening  You may have the following tests or measurements:  Height, weight, and BMI.  Blood pressure.  Lipid and cholesterol levels. These may be checked every 5 years, or more frequently if you are over 4 years old.  Skin check.  Lung cancer screening. You may have this screening every year starting at age 55 if you have a 30-pack-year history of smoking and currently smoke or have quit within the past 15 years.  Fecal occult blood test (FOBT) of the stool. You may have this test every year starting at age 66.  Flexible sigmoidoscopy or colonoscopy. You may have a sigmoidoscopy every 5 years or a colonoscopy every 10 years starting at age 79.  Hepatitis C blood test.  Hepatitis B blood test.  Sexually transmitted disease (STD) testing.  Diabetes screening. This is done by checking your blood sugar (glucose) after you have not eaten for a while (fasting). You may have this done every 1-3 years.  Bone density scan. This is done to screen for osteoporosis. You may have this done starting at age 25.  Mammogram. This may be done every 1-2 years. Talk to your health care provider about how often you should have regular mammograms. Talk with your health care  provider about your test results, treatment options, and if necessary, the need for more tests. Vaccines  Your health care provider may recommend certain vaccines, such as:  Influenza vaccine. This is recommended every year.  Tetanus, diphtheria, and  acellular pertussis (Tdap, Td) vaccine. You may need a Td booster every 10 years.  Zoster vaccine. You may need this after age 48.  Pneumococcal 13-valent conjugate (PCV13) vaccine. One dose is recommended after age 25.  Pneumococcal polysaccharide (PPSV23) vaccine. One dose is recommended after age 28. Talk to your health care provider about which screenings and vaccines you need and how often you need them. This information is not intended to replace advice given to you by your health care provider. Make sure you discuss any questions you have with your health care provider. Document Released: 12/10/2015 Document Revised: 08/02/2016 Document Reviewed: 09/14/2015 Elsevier Interactive Patient Education  2017 [place] Prevention in the Home Falls can cause injuries. They can happen to people of all ages. There are many things you can do to make your home safe and to help prevent falls. What can I do on the outside of my home?  Regularly fix the edges of walkways and driveways and fix any cracks.  Remove anything that might make you trip as you walk through a door, such as a raised step or threshold.  Trim any bushes or trees on the path to your home.  Use bright outdoor lighting.  Clear any walking paths of anything that might make someone trip, such as rocks or tools.  Regularly check to see if handrails are loose or broken. Make sure that both sides of any steps have handrails.  Any raised decks and porches should have guardrails on the edges.  Have any leaves, snow, or ice cleared regularly.  Use sand or salt on walking paths during winter.  Clean up any spills in your garage right away. This includes oil or grease spills. What can I do in the bathroom?  Use night lights.  Install grab bars by the toilet and in the tub and shower. Do not use towel bars as grab bars.  Use non-skid mats or decals in the tub or shower.  If you need to sit down in the shower, use  a plastic, non-slip stool.  Keep the floor dry. Clean up any water that spills on the floor as soon as it happens.  Remove soap buildup in the tub or shower regularly.  Attach bath mats securely with double-sided non-slip rug tape.  Do not have throw rugs and other things on the floor that can make you trip. What can I do in the bedroom?  Use night lights.  Make sure that you have a light by your bed that is easy to reach.  Do not use any sheets or blankets that are too big for your bed. They should not hang down onto the floor.  Have a firm chair that has side arms. You can use this for support while you get dressed.  Do not have throw rugs and other things on the floor that can make you trip. What can I do in the kitchen?  Clean up any spills right away.  Avoid walking on wet floors.  Keep items that you use a lot in easy-to-reach places.  If you need to reach something above you, use a strong step stool that has a grab bar.  Keep electrical cords out of the way.  Do not use floor polish  or wax that makes floors slippery. If you must use wax, use non-skid floor wax.  Do not have throw rugs and other things on the floor that can make you trip. What can I do with my stairs?  Do not leave any items on the stairs.  Make sure that there are handrails on both sides of the stairs and use them. Fix handrails that are broken or loose. Make sure that handrails are as long as the stairways.  Check any carpeting to make sure that it is firmly attached to the stairs. Fix any carpet that is loose or worn.  Avoid having throw rugs at the top or bottom of the stairs. If you do have throw rugs, attach them to the floor with carpet tape.  Make sure that you have a light switch at the top of the stairs and the bottom of the stairs. If you do not have them, ask someone to add them for you. What else can I do to help prevent falls?  Wear shoes that:  Do not have high heels.  Have  rubber bottoms.  Are comfortable and fit you well.  Are closed at the toe. Do not wear sandals.  If you use a stepladder:  Make sure that it is fully opened. Do not climb a closed stepladder.  Make sure that both sides of the stepladder are locked into place.  Ask someone to hold it for you, if possible.  Clearly mark and make sure that you can see:  Any grab bars or handrails.  First and last steps.  Where the edge of each step is.  Use tools that help you move around (mobility aids) if they are needed. These include:  Canes.  Walkers.  Scooters.  Crutches.  Turn on the lights when you go into a dark area. Replace any light bulbs as soon as they burn out.  Set up your furniture so you have a clear path. Avoid moving your furniture around.  If any of your floors are uneven, fix them.  If there are any pets around you, be aware of where they are.  Review your medicines with your doctor. Some medicines can make you feel dizzy. This can increase your chance of falling. Ask your doctor what other things that you can do to help prevent falls. This information is not intended to replace advice given to you by your health care provider. Make sure you discuss any questions you have with your health care provider. Document Released: 09/09/2009 Document Revised: 04/20/2016 Document Reviewed: 12/18/2014 Elsevier Interactive Patient Education  2017 [employer].

## 2017-07-17 NOTE — Progress Notes (Signed)
[facility]   Subjective:   Brittany Oneal is a 83 y.o. female who presents for Medicare Annual (Subsequent) preventive examination.  Review of Systems:  Cardiac Risk Factors include: advanced age (>31men, >33 women);dyslipidemia;hypertension     Objective:     Vitals: BP (!) 158/78 (BP Location: Right Arm, Cuff Size: Normal)   Pulse 75   Temp 97.9 F (36.6 C)   Resp 16   Ht 5\' 6"  (1.676 m)   Wt 125 lb 6.4 oz (56.9 kg)   BMI 20.24 kg/m   Body mass index is 20.24 kg/m.   Tobacco History  Smoking Status  . Never Smoker  Smokeless Tobacco  . Never Used     Counseling given: Not Answered   Past Medical History:  Diagnosis Date  . Arthritis   . Family history of malignant neoplasm of breast   . Family history of malignant neoplasm of gastrointestinal tract   . History of colon cancer   . Personal history of malignant neoplasm of large intestine   . Special screening for malignant neoplasms, colon   . Thyroid disease   . Unspecified essential hypertension    Past Surgical History:  Procedure Laterality Date  . ABDOMINAL HYSTERECTOMY  1983  . COLON SURGERY  1990   resection d/t cancer  . COLONOSCOPY  1997,2001,2004,2006, 2011  . EYE SURGERY  Sept 2013  . 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 SALPINGOOPHORECTOMY     Family History  Problem Relation Age of Onset  . Hypertension Mother   . Hypertension Father   . Diabetes Father   . Cancer Other        FH of breast and colon cancer  . Cancer Brother        colon  . Cancer Sister        breast   History  Sexual Activity  . Sexual activity: Not on file    Outpatient Encounter Prescriptions as of 07/17/2017  Medication Sig  . aspirin EC 81 MG tablet Take 1 tablet (81 mg total) by mouth daily.  . calcium carbonate (OS-CAL - DOSED IN MG OF ELEMENTAL CALCIUM) 1250 MG tablet Take 1 tablet by mouth.  . fluticasone (FLONASE) 50 MCG/ACT nasal spray Place 1 spray into the nose as needed.  .  Glucosamine-Chondroitin (GLUCOSAMINE CHONDR COMPLEX) 500-400 MG CAPS Take 2 capsules by mouth daily.  Marland Kitchen levothyroxine (SYNTHROID, LEVOTHROID) 50 MCG tablet Take 1 tablet (50 mcg total) by mouth daily.  Marland Kitchen lisinopril (PRINIVIL,ZESTRIL) 40 MG tablet Take 0.5 tablets (20 mg total) by mouth 2 (two) times daily.  Marland Kitchen loratadine (CLARITIN) 10 MG tablet Take 10 mg by mouth daily as needed for allergies.  . Omega-3 Fatty Acids (FISH OIL) 1000 MG CAPS Take 1 capsule by mouth daily.   . meloxicam (MOBIC) 7.5 MG tablet Take 1 tablet (7.5 mg total) by mouth daily. (Patient not taking: Reported on 07/17/2017)   No facility-administered encounter medications on file as of 07/17/2017.     Activities of Daily Living In your present state of health, do you have any difficulty performing the following activities: 07/17/2017 08/22/2016  Hearing? N N  Vision? N N  Difficulty concentrating or making decisions? N N  Walking or climbing stairs? N N  Dressing or bathing? N N  Doing errands, shopping? N N  Preparing Food and eating ? N N  Using the Toilet? N N  In the past six months, have you accidently leaked  urine? N N  Do you have problems with loss of bowel control? N N  Managing your Medications? N N  Managing your Finances? N N  Housekeeping or managing your Housekeeping? N N  Some recent data might be hidden    Patient Care Team: Olin Hauser, DO as PCP - General (Family Medicine) Christene Lye, MD (General Surgery) Dasher, Rayvon Char, MD (Dermatology)    Assessment:     Exercise Activities and Dietary recommendations Current Exercise Habits: Structured exercise class, Type of exercise: walking;strength training/weights, Time (Minutes): > 60, Frequency (Times/Week): 2, Weekly Exercise (Minutes/Week): 0, Intensity: Mild, Exercise limited by: None identified  Goals    . Exercise 3x per week (30 min per time)          Patient and her spouse exercise twice a weekly. She says it would  be a goal to be able to increase number of days. Her overall goal is to live a healthy life.    . Increase water intake          Recommend drinking at least 4-5 glasses of water a day       Fall Risk Fall Risk  07/17/2017 11/28/2016 08/22/2016 04/18/2016 01/18/2016  Falls in the past year? No No No No Yes  Number falls in past yr: - - - - 1  Injury with Fall? - - - - No  Follow up - - - - Follow up appointment   Depression Screen PHQ 2/9 Scores 07/17/2017 11/28/2016 08/22/2016 04/18/2016  PHQ - 2 Score 0 0 0 0     Cognitive Function MMSE - Mini Mental State Exam 08/22/2016  Orientation to time 5  Orientation to Place 5  Registration 3  Attention/ Calculation 5  Recall 3  Language- name 2 objects 2  Language- repeat 1  Language- follow 3 step command 3  Language- read & follow direction 1  Write a sentence 1  Copy design 0  Total score 29     6CIT Screen 07/17/2017  What Year? 0 points  What month? 0 points  What time? 0 points  Count back from 20 0 points  Months in reverse 0 points  Repeat phrase 2 points  Total Score 2    Immunization History  Administered Date(s) Administered  . Influenza, High Dose Seasonal PF 08/03/2015, 08/22/2016  . Influenza, Seasonal, Injecte, Preservative Fre 08/20/2013  . Influenza-Unspecified 08/03/2015  . Pneumococcal Conjugate-13 09/29/2014, 10/07/2015  . Pneumococcal Polysaccharide-23 11/27/1994  . Tdap 01/21/2014   Screening Tests Health Maintenance  Topic Date Due  . INFLUENZA VACCINE  06/27/2017  . TETANUS/TDAP  01/22/2024  . DEXA SCAN  Completed  . PNA vac Low Risk Adult  Completed      Plan:     I have personally reviewed and addressed the Medicare Annual Wellness questionnaire and have noted the following in the patient's chart:  A. Medical and social history B. Use of alcohol, tobacco or illicit drugs  C. Current medications and supplements D. Functional ability and status E.  Nutritional status F.  Physical  activity G. Advance directives H. List of other physicians I.  Hospitalizations, surgeries, and ER visits in previous 12 months J.  [place] such as hearing and vision if needed, cognitive and depression L. Referrals and appointments   In addition, I have reviewed and discussed with patient certain preventive protocols, quality metrics, and best practice recommendations. A written personalized care plan for preventive services as well as general preventive  health recommendations were provided to patient.   Signed,  Tyler Aas, LPN Nurse Health Advisor   MD Recommendations: patient brought copy of BP log, copy given to CMA.

## 2017-07-31 ENCOUNTER — Encounter: Payer: Self-pay | Admitting: Family Medicine

## 2017-07-31 ENCOUNTER — Other Ambulatory Visit: Payer: Self-pay | Admitting: Family Medicine

## 2017-07-31 ENCOUNTER — Ambulatory Visit (INDEPENDENT_AMBULATORY_CARE_PROVIDER_SITE_OTHER): Payer: Medicare Other | Admitting: Family Medicine

## 2017-07-31 VITALS — BP 120/82 | HR 71 | Temp 97.7°F | Resp 16 | Ht 66.0 in | Wt 124.6 lb

## 2017-07-31 DIAGNOSIS — I10 Essential (primary) hypertension: Secondary | ICD-10-CM | POA: Diagnosis not present

## 2017-07-31 DIAGNOSIS — R7303 Prediabetes: Secondary | ICD-10-CM

## 2017-07-31 DIAGNOSIS — E7800 Pure hypercholesterolemia, unspecified: Secondary | ICD-10-CM

## 2017-07-31 DIAGNOSIS — Z23 Encounter for immunization: Secondary | ICD-10-CM

## 2017-07-31 DIAGNOSIS — E039 Hypothyroidism, unspecified: Secondary | ICD-10-CM

## 2017-07-31 NOTE — Progress Notes (Signed)
Subjective:    Patient ID: Brittany Oneal, female    DOB: 12-24-1930, 83 y.o.   MRN: [id number]  Brittany Oneal is a 83 y.o. female presenting on 07/31/2017 for Hypertension (pt's B/P reading at home ranges 126/72)   HPI   CHRONIC HTN: - Last visit with me 04/30/17, for initial visit with me for HTN, treated with continued same dose Lisinopril 6m BID and increased monitoring BP at home, see prior notes for background information. - Interval update with continued regimen and home readings show excellent BP control often 100-120 avg SBP, occasional low BP readings with hypotension SBP 90s and with reports of feeling symptomatic - Today patient reports concern may need lower BP med. Feels good today, asymptomatic. However BP in office was elevated today. Current Meds - Lisinopril 471m- takes HALF dose 2079mn AM and 71m36m PM   Reports good compliance, took meds today. Tolerating well, w/o complaints - Taking ASA 81mg35mly tolerating well Denies CP, dyspnea, HA, edema, dizziness / lightheadedness  Health Maintenance: - UTD Pneumonia vaccine, reviewed prior record - Due for Flu Shot, will receive today - Due for Shingles vaccine, Shingrix, will check cost and return  Social History  Substance Use Topics  . Smoking status: Never Smoker  . Smokeless tobacco: Never Used  . Alcohol use No    Review of Systems Per HPI unless specifically indicated above     Objective:    BP 120/82 (BP Location: Left Arm, Cuff Size: Normal)   Pulse 71   Temp 97.7 F (36.5 C) (Oral)   Resp 16   Ht 5' 6" (1.676 m)   Wt 124 lb 9.6 oz (56.5 kg)   BMI 20.11 kg/m   Wt Readings from Last 3 Encounters:  07/31/17 124 lb 9.6 oz (56.5 kg)  07/17/17 125 lb 6.4 oz (56.9 kg)  04/30/17 126 lb (57.2 kg)    Physical Exam  Constitutional: She is oriented to person, place, and time. She appears well-developed and well-nourished. No distress.  Well-appearing, comfortable, cooperative  HENT:  Head:  Normocephalic and atraumatic.  Mouth/Throat: Oropharynx is clear and moist.  L eye stable dysconjugate gaze. R eye frequent blinking  Eyes: Conjunctivae are normal. Right eye exhibits no discharge. Left eye exhibits no discharge.  Neck: Normal range of motion. Neck supple. No thyromegaly present.  Cardiovascular: Normal rate, regular rhythm, normal heart sounds and intact distal pulses.   No murmur heard. Pulmonary/Chest: Effort normal and breath sounds normal. No respiratory distress. She has no wheezes. She has no rales.  Musculoskeletal: Normal range of motion. She exhibits no edema.  Lymphadenopathy:    She has no cervical adenopathy.  Neurological: She is alert and oriented to person, place, and time.  Skin: Skin is warm and dry. No rash noted. She is not diaphoretic. No erythema.  Psychiatric: She has a normal mood and affect. Her behavior is normal.  Well groomed, good eye contact, normal speech and thoughts  Nursing note and vitals reviewed.    Results for orders placed or performed in visit on 12/26/16  COMPLETE METABOLIC PANEL WITH GFR  Result Value Ref Range   Sodium 139 135 - 146 mmol/L   Potassium 4.2 3.5 - 5.3 mmol/L   Chloride 100 98 - 110 mmol/L   CO2 28 20 - 31 mmol/L   Glucose, Bld 80 65 - 99 mg/dL   BUN 17 7 - 25 mg/dL   Creat 0.80 0.60 - 0.88 mg/dL  Total Bilirubin 0.6 0.2 - 1.2 mg/dL   Alkaline Phosphatase 49 33 - 130 U/L   AST 17 10 - 35 U/L   ALT 9 6 - 29 U/L   Total Protein 6.2 6.1 - 8.1 g/dL   Albumin 4.1 3.6 - 5.1 g/dL   Calcium 9.0 8.6 - 10.4 mg/dL   GFR, Est African American 79 >=60 mL/min   GFR, Est Non African American 68 >=60 mL/min  CBC with Differential  Result Value Ref Range   WBC 4.2 3.8 - 10.8 K/uL   RBC 4.45 3.80 - 5.10 MIL/uL   Hemoglobin 13.1 11.7 - 15.5 g/dL   HCT 38.9 35.0 - 45.0 %   MCV 87.4 80.0 - 100.0 fL   MCH 29.4 27.0 - 33.0 pg   MCHC 33.7 32.0 - 36.0 g/dL   RDW 13.8 11.0 - 15.0 %   Platelets 178 140 - 400 K/uL   MPV 9.7  7.5 - 12.5 fL   Neutro Abs 1,680 1,500 - 7,800 cells/uL   Lymphs Abs 1,764 850 - 3,900 cells/uL   Monocytes Absolute 504 200 - 950 cells/uL   Eosinophils Absolute 210 15 - 500 cells/uL   Basophils Absolute 42 0 - 200 cells/uL   Neutrophils Relative % 40 %   Lymphocytes Relative 42 %   Monocytes Relative 12 %   Eosinophils Relative 5 %   Basophils Relative 1 %   Smear Review Criteria for review not met   Lipid Profile  Result Value Ref Range   Cholesterol 160 <200 mg/dL   Triglycerides 65 <150 mg/dL   HDL 71 >50 mg/dL   Total CHOL/HDL Ratio 2.3 <5.0 Ratio   VLDL 13 <30 mg/dL   LDL Cholesterol 76 <100 mg/dL  TSH  Result Value Ref Range   TSH 1.72 mIU/L  HgB A1c  Result Value Ref Range   Hgb A1c MFr Bld 5.2 <5.7 %   Mean Plasma Glucose 103 mg/dL      Assessment & Plan:   Problem List Items Addressed This Visit    Hypertension - Primary (Chronic)    Again initially elevated BP, repeat manual check improved to normal range, again seems to have pulsations on BP cuff without actual BP sound. No known complications History of hypotension / syncope    Plan:  1. REDUCE BP med - Lisinopril 25m daily in AM - (half tab of 40, no longer take evening dose) 2. Encourage improved lifestyle - continue low sodium diet, regular exercise 3. Continue close monitor BP outside office 1-2x weekly, bring readings, concern hypotension or inc BP with lower med 4. Follow-up 6 months for annual phys + labs 12/2017       Other Visit Diagnoses    Needs flu shot       Relevant Orders   Flu vaccine HIGH DOSE PF (Completed)      No orders of the defined types were placed in this encounter.     Follow up plan: Return in about 6 months (around 01/28/2018) for Medicare Physical CPE.  ANobie Putnam DRoslyn [facility] 07/31/2017, 9:16 PM

## 2017-07-31 NOTE — Patient Instructions (Addendum)
Thank you for coming to the clinic today.  1. Keep checking BP closely 2. LOWER Lisinopril dose to only HALF pill (20mg ) in morning - STOP taking evening dose - If BP is stable to improved, less low numbers, < 100/70, and no further symptoms of low blood pressure, then we can continue with this, otherwise may need even LOWER dose like 10mg  - CALL me in 2-4 weeks with BP update, and we can make change to rx  UTD PNeumonia vaccine  Due Flu Shot today  Due for Shingrix Shingles vaccine 2 dose series - CALL INSURANCE to check cost and coverage, if you want it then we can do it at our office  DUE for FASTING BLOOD WORK (no food or drink after midnight before the lab appointment, only water or coffee without cream/sugar on the morning of)  SCHEDULE "Lab Only" visit in the morning at the clinic for lab draw in 6 months  - Make sure Lab Only appointment is at about 1 week before your next appointment, so that results will be available  For Lab Results, once available within 2-3 days of blood draw, you can can log in to MyChart online to view your results and a brief explanation. Also, we can discuss results at next follow-up visit.   Please schedule a Follow-up Appointment to: Return in about 6 months (around 01/28/2018) for Medicare Physical CPE.  If you have any other questions or concerns, please feel free to call the clinic or send a message through [place]. You may also schedule an earlier appointment if necessary.  Additionally, you may be receiving a survey about your experience at our clinic within a few days to 1 week by e-mail or mail. We value your feedback.  Brittany Putnam, DO [place]

## 2017-07-31 NOTE — Assessment & Plan Note (Signed)
Again initially elevated BP, repeat manual check improved to normal range, again seems to have pulsations on BP cuff without actual BP sound. No known complications History of hypotension / syncope    Plan:  1. REDUCE BP med - Lisinopril 20mg  daily in AM - (half tab of 40, no longer take evening dose) 2. Encourage improved lifestyle - continue low sodium diet, regular exercise 3. Continue close monitor BP outside office 1-2x weekly, bring readings, concern hypotension or inc BP with lower med 4. Follow-up 6 months for annual phys + labs 12/2017

## 2017-08-30 ENCOUNTER — Encounter: Payer: Self-pay | Admitting: General Surgery

## 2017-09-05 ENCOUNTER — Encounter: Payer: Self-pay | Admitting: *Deleted

## 2017-09-05 ENCOUNTER — Ambulatory Visit (INDEPENDENT_AMBULATORY_CARE_PROVIDER_SITE_OTHER): Payer: Medicare Other | Admitting: General Surgery

## 2017-09-05 ENCOUNTER — Other Ambulatory Visit: Payer: Self-pay | Admitting: *Deleted

## 2017-09-05 ENCOUNTER — Encounter: Payer: Self-pay | Admitting: General Surgery

## 2017-09-05 VITALS — BP 160/78 | HR 74 | Resp 11 | Ht 66.0 in | Wt 127.0 lb

## 2017-09-05 DIAGNOSIS — Z85038 Personal history of other malignant neoplasm of large intestine: Secondary | ICD-10-CM | POA: Diagnosis not present

## 2017-09-05 DIAGNOSIS — Z803 Family history of malignant neoplasm of breast: Secondary | ICD-10-CM | POA: Diagnosis not present

## 2017-09-05 MED ORDER — POLYETHYLENE GLYCOL 3350 17 GM/SCOOP PO POWD: ORAL | 0 refills | Status: DC

## 2017-09-05 NOTE — Progress Notes (Signed)
Patient ID: Brittany Oneal, female   DOB: [birth year], 83 y.o.   MRN: [id number]  Chief Complaint  Patient presents with  . Follow-up    mammogram    HPI Brittany Oneal is a 83 y.o. female.  who presents for a breast evaluation. The most recent mammogram was done on 08-28-17.  Patient does perform regular self breast checks and gets regular mammograms done.   No new breast issues. History of colon cancer, no GI issues.  HPI  Past Medical History:  Diagnosis Date  . Arthritis   . Family history of malignant neoplasm of breast   . Family history of malignant neoplasm of gastrointestinal tract   . History of colon cancer   . Personal history of malignant neoplasm of large intestine   . Special screening for malignant neoplasms, colon   . Thyroid disease   . Unspecified essential hypertension     Past Surgical History:  Procedure Laterality Date  . ABDOMINAL HYSTERECTOMY  1983  . COLON SURGERY  1990   resection d/t cancer  . COLONOSCOPY  1997,2001,2004,2006, 2011  . EYE SURGERY  Sept 2013  . 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 SALPINGOOPHORECTOMY      Family History  Problem Relation Age of Onset  . Hypertension Mother   . Hypertension Father   . Diabetes Father   . Cancer Other        FH of breast and colon cancer  . Cancer Brother        colon  . Cancer Sister        breast    Social History Social History  Substance Use Topics  . Smoking status: Never Smoker  . Smokeless tobacco: Never Used  . Alcohol use No    Allergies  Allergen Reactions  . Contrast Media [Iodinated Diagnostic Agents] Other (See Comments)    Reaction not listed  . Sulfa Antibiotics Hives  . Shellfish Allergy Rash    Current Outpatient Prescriptions  Medication Sig Dispense Refill  . aspirin EC 81 MG tablet Take 1 tablet (81 mg total) by mouth daily.    . calcium carbonate (OS-CAL - DOSED IN MG OF ELEMENTAL CALCIUM) 1250 MG tablet Take 1 tablet by mouth.    .  fluticasone (FLONASE) 50 MCG/ACT nasal spray Place 1 spray into the nose as needed.    . Glucosamine-Chondroitin (GLUCOSAMINE CHONDR COMPLEX) 500-400 MG CAPS Take 2 capsules by mouth daily.    Marland Kitchen levothyroxine (SYNTHROID, LEVOTHROID) 50 MCG tablet Take 1 tablet (50 mcg total) by mouth daily. 90 tablet 3  . lisinopril (PRINIVIL,ZESTRIL) 40 MG tablet Take 0.5 tablets (20 mg total) by mouth 2 (two) times daily. 90 tablet 3  . loratadine (CLARITIN) 10 MG tablet Take 10 mg by mouth daily as needed for allergies.    . Omega-3 Fatty Acids (FISH OIL) 1000 MG CAPS Take 1 capsule by mouth daily.      No current facility-administered medications for this visit.     Review of Systems Review of Systems  Constitutional: Negative.   Respiratory: Negative.   Cardiovascular: Negative.     Blood pressure (!) 160/78, pulse 74, resp. rate 11, height 5\' 6"  (1.676 m), weight 127 lb (57.6 kg).  Physical Exam Physical Exam  Constitutional: She is oriented to person, place, and time. She appears well-developed and well-nourished.  HENT:  Mouth/Throat: Oropharynx is clear and moist.  Eyes: Conjunctivae are normal. No scleral icterus.  Neck:  Neck supple.  Cardiovascular: Normal rate, regular rhythm and normal heart sounds.   Pulmonary/Chest: Effort normal and breath sounds normal. No respiratory distress. Right breast exhibits no inverted nipple, no mass, no nipple discharge, no skin change and no tenderness. Left breast exhibits no inverted nipple, no mass, no nipple discharge, no skin change and no tenderness.  Abdominal: Soft. There is no tenderness.  Lymphadenopathy:    She has no cervical adenopathy.    She has no axillary adenopathy.  Neurological: She is alert and oriented to person, place, and time.  Skin: Skin is warm and dry.  Psychiatric: Her behavior is normal.    Data Reviewed  Mammogram reviewed and stable.  Assessment    Stable physical exam. History of colon cancer. Family history of  breast cancer.    Plan    Last colonoscopy was done in 2011. She is willing to have it done one more time Patient will be asked to return to her primary care physician, Dr Parks Ranger in one year, she will decide if she wants a bilateral screening mammogram.  Colonoscopy with possible biopsy/polypectomy prn: Information regarding the procedure, including its potential risks and complications (including but not limited to perforation of the bowel, which may require emergency surgery to repair, and bleeding) was verbally given to the patient. Educational information regarding lower intestinal endoscopy was given to the patient. Written instructions for how to complete the bowel prep using Miralax were provided. The importance of drinking ample fluids to avoid dehydration as a result of the prep emphasized.       HPI, Physical Exam, Assessment and Plan have been scribed under the direction and in the presence of Mckinley Jewel, MD Karie Fetch, RN  I have completed the exam and reviewed the above documentation for accuracy and completeness.  I agree with the above.  Haematologist has been used and any errors in dictation or transcription are unintentional.  Seeplaputhur G. Jamal Collin, M.D., F.A.C.S.  Junie Panning G 09/05/2017, 3:29 PM

## 2017-09-05 NOTE — Progress Notes (Signed)
Patient has been scheduled for a colonoscopy on 10-24-17 at [facility]. Miralax prescription has been sent in to the patient's pharmacy today. She will only take her blood pressure pill the morning of procedure. It is okay for patient to continue an 81 mg aspirin once daily. Colonoscopy instructions have been reviewed with the patient. This patient is aware to call the office if they have further questions.

## 2017-09-05 NOTE — Patient Instructions (Addendum)
The patient is aware to call back for any questions or concerns.  Colonoscopy, Adult A colonoscopy is an exam to look at the entire large intestine. During the exam, a lubricated, bendable tube is inserted into the anus and then passed into the rectum, colon, and other parts of the large intestine. A colonoscopy is often done as a part of normal colorectal screening or in response to certain symptoms, such as anemia, persistent diarrhea, abdominal pain, and blood in the stool. The exam can help screen for and diagnose medical problems, including:  Tumors.  Polyps.  Inflammation.  Areas of bleeding.  Tell a health care provider about:  Any allergies you have.  All medicines you are taking, including vitamins, herbs, eye drops, creams, and over-the-counter medicines.  Any problems you or family members have had with anesthetic medicines.  Any blood disorders you have.  Any surgeries you have had.  Any medical conditions you have.  Any problems you have had passing stool. What are the risks? Generally, this is a safe procedure. However, problems may occur, including:  Bleeding.  A tear in the intestine.  A reaction to medicines given during the exam.  Infection (rare).  What happens before the procedure? Eating and drinking restrictions Follow instructions from your health care provider about eating and drinking, which may include:  A few days before the procedure - follow a low-fiber diet. Avoid nuts, seeds, dried fruit, raw fruits, and vegetables.  1-3 days before the procedure - follow a clear liquid diet. Drink only clear liquids, such as clear broth or bouillon, black coffee or tea, clear juice, clear soft drinks or sports drinks, gelatin dessert, and popsicles. Avoid any liquids that contain red or purple dye.  On the day of the procedure - do not eat or drink anything during the 2 hours before the procedure, or within the time period that your health care provider  recommends.  Bowel prep If you were prescribed an oral bowel prep to clean out your colon:  Take it as told by your health care provider. Starting the day before your procedure, you will need to drink a large amount of medicated liquid. The liquid will cause you to have multiple loose stools until your stool is almost clear or light green.  If your skin or anus gets irritated from diarrhea, you may use these to relieve the irritation: ? Medicated wipes, such as adult wet wipes with aloe and vitamin E. ? A skin soothing-product like petroleum jelly.  If you vomit while drinking the bowel prep, take a break for up to 60 minutes and then begin the bowel prep again. If vomiting continues and you cannot take the bowel prep without vomiting, call your health care provider.  General instructions  Ask your health care provider about changing or stopping your regular medicines. This is especially important if you are taking diabetes medicines or blood thinners.  Plan to have someone take you home from the hospital or clinic. What happens during the procedure?  An IV tube may be inserted into one of your veins.  You will be given medicine to help you relax (sedative).  To reduce your risk of infection: ? Your health care team will wash or sanitize their hands. ? Your anal area will be washed with soap.  You will be asked to lie on your side with your knees bent.  Your health care provider will lubricate a long, thin, flexible tube. The tube will have a camera and   a light on the end.  The tube will be inserted into your anus.  The tube will be gently eased through your rectum and colon.  Air will be delivered into your colon to keep it open. You may feel some pressure or cramping.  The camera will be used to take images during the procedure.  A small tissue sample may be removed from your body to be examined under a microscope (biopsy). If any potential problems are found, the tissue  will be sent to a lab for testing.  If small polyps are found, your health care provider may remove them and have them checked for cancer cells.  The tube that was inserted into your anus will be slowly removed. The procedure may vary among health care providers and hospitals. What happens after the procedure?  Your blood pressure, heart rate, breathing rate, and blood oxygen level will be monitored until the medicines you were given have worn off.  Do not drive for 24 hours after the exam.  You may have a small amount of blood in your stool.  You may pass gas and have mild abdominal cramping or bloating due to the air that was used to inflate your colon during the exam.  It is up to you to get the results of your procedure. Ask your health care provider, or the department performing the procedure, when your results will be ready. This information is not intended to replace advice given to you by your health care provider. Make sure you discuss any questions you have with your health care provider. Document Released: 11/10/2000 Document Revised: 09/13/2016 Document Reviewed: 01/25/2016 Elsevier Interactive Patient Education  2018 [employer]vier Inc[employer]  

## 2017-10-17 ENCOUNTER — Other Ambulatory Visit: Payer: Self-pay | Admitting: General Surgery

## 2017-10-17 DIAGNOSIS — Z85038 Personal history of other malignant neoplasm of large intestine: Secondary | ICD-10-CM

## 2017-10-22 ENCOUNTER — Telehealth: Payer: Self-pay | Admitting: *Deleted

## 2017-10-22 NOTE — Telephone Encounter (Signed)
Patient was contacted today and confirms that she has stopped taking her fish oil.  This patient reports that she has picked up Miralax prescription.  We will proceed with colonoscopy as scheduled for 10-24-17 at [facility].   Patient was encouraged to call the office should she have further questions.

## 2017-10-24 ENCOUNTER — Ambulatory Visit
Admission: RE | Admit: 2017-10-24 | Discharge: 2017-10-24 | Disposition: A | Discharge status: Home / Self Care | Payer: Medicare Other | Source: Ambulatory Visit | Attending: General Surgery | Admitting: General Surgery

## 2017-10-24 ENCOUNTER — Ambulatory Visit: Payer: Medicare Other | Admitting: Anesthesiology

## 2017-10-24 ENCOUNTER — Encounter
Admission: RE | Disposition: A | Discharge status: Home / Self Care | Payer: Self-pay | Source: Ambulatory Visit | Attending: General Surgery

## 2017-10-24 DIAGNOSIS — K5730 Diverticulosis of large intestine without perforation or abscess without bleeding: Secondary | ICD-10-CM

## 2017-10-24 DIAGNOSIS — E079 Disorder of thyroid, unspecified: Secondary | ICD-10-CM | POA: Diagnosis not present

## 2017-10-24 DIAGNOSIS — I10 Essential (primary) hypertension: Secondary | ICD-10-CM | POA: Diagnosis not present

## 2017-10-24 DIAGNOSIS — Z1211 Encounter for screening for malignant neoplasm of colon: Secondary | ICD-10-CM | POA: Insufficient documentation

## 2017-10-24 DIAGNOSIS — Z7982 Long term (current) use of aspirin: Secondary | ICD-10-CM | POA: Insufficient documentation

## 2017-10-24 DIAGNOSIS — Z79899 Other long term (current) drug therapy: Secondary | ICD-10-CM | POA: Insufficient documentation

## 2017-10-24 DIAGNOSIS — Z85038 Personal history of other malignant neoplasm of large intestine: Secondary | ICD-10-CM | POA: Diagnosis not present

## 2017-10-24 HISTORY — PX: COLONOSCOPY WITH PROPOFOL: SHX5780

## 2017-10-24 SURGERY — COLONOSCOPY WITH PROPOFOL
Anesthesia: General

## 2017-10-24 MED ORDER — PROPOFOL 500 MG/50ML IV EMUL
INTRAVENOUS | Status: DC | PRN
  Administered 2017-10-24: 120 ug/kg/min via INTRAVENOUS

## 2017-10-24 MED ORDER — PROPOFOL 10 MG/ML IV BOLUS
INTRAVENOUS | Status: DC | PRN
  Administered 2017-10-24: 30 mg via INTRAVENOUS

## 2017-10-24 MED ORDER — EPHEDRINE SULFATE 50 MG/ML IJ SOLN
INTRAMUSCULAR | Status: AC
  Filled 2017-10-24: qty 1

## 2017-10-24 MED ORDER — LIDOCAINE HCL (PF) 2 % IJ SOLN
INTRAMUSCULAR | Status: AC
  Filled 2017-10-24: qty 10

## 2017-10-24 MED ORDER — PROPOFOL 10 MG/ML IV BOLUS
INTRAVENOUS | Status: AC
  Filled 2017-10-24: qty 20

## 2017-10-24 MED ORDER — SODIUM CHLORIDE 0.9 % IV SOLN
INTRAVENOUS | Status: DC
  Administered 2017-10-24: 1000 mL via INTRAVENOUS

## 2017-10-24 MED ORDER — EPHEDRINE SULFATE 50 MG/ML IJ SOLN
INTRAMUSCULAR | Status: DC | PRN
  Administered 2017-10-24: 5 mg via INTRAVENOUS

## 2017-10-24 NOTE — H&P (Signed)
Brittany Oneal is an 83 y.o. female.   Chief Complaint: here for colonoscopy HPI: 83 yr old female , s/p sigmoid resection for CA many yrs ago. No GI complaints. Last colonoscopy was in 2011.  Past Medical History:  Diagnosis Date  . Arthritis   . Family history of malignant neoplasm of breast   . Family history of malignant neoplasm of gastrointestinal tract   . History of colon cancer   . Personal history of malignant neoplasm of large intestine   . Special screening for malignant neoplasms, colon   . Thyroid disease   . Unspecified essential hypertension     Past Surgical History:  Procedure Laterality Date  . ABDOMINAL HYSTERECTOMY  1983  . COLON SURGERY  1990   resection d/t cancer  . COLONOSCOPY  1997,2001,2004,2006, 2011  . EYE SURGERY  Sept 2013  . 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 SALPINGOOPHORECTOMY      Family History  Problem Relation Age of Onset  . Hypertension Mother   . Hypertension Father   . Diabetes Father   . Cancer Other        FH of breast and colon cancer  . Cancer Brother        colon  . Cancer Sister        breast   Social History:  reports that  has never smoked. she has never used smokeless tobacco. She reports that she does not drink alcohol or use drugs.  Allergies:  Allergies  Allergen Reactions  . Contrast Media [Iodinated Diagnostic Agents] Other (See Comments)    Reaction not listed  . Sulfa Antibiotics Hives  . Shellfish Allergy Rash    Medications Prior to Admission  Medication Sig Dispense Refill  . aspirin EC 81 MG tablet Take 1 tablet (81 mg total) by mouth daily.    . calcium carbonate (OS-CAL - DOSED IN MG OF ELEMENTAL CALCIUM) 1250 MG tablet Take 1 tablet by mouth.    . fluticasone (FLONASE) 50 MCG/ACT nasal spray Place 1 spray into the nose as needed.    . Glucosamine-Chondroitin (GLUCOSAMINE CHONDR COMPLEX) 500-400 MG CAPS Take 2 capsules by mouth daily.    Marland Kitchen levothyroxine (SYNTHROID, LEVOTHROID)  50 MCG tablet Take 1 tablet (50 mcg total) by mouth daily. 90 tablet 3  . lisinopril (PRINIVIL,ZESTRIL) 40 MG tablet Take 0.5 tablets (20 mg total) by mouth 2 (two) times daily. 90 tablet 3  . loratadine (CLARITIN) 10 MG tablet Take 10 mg by mouth daily as needed for allergies.    . Omega-3 Fatty Acids (FISH OIL) 1000 MG CAPS Take 1 capsule by mouth daily.     . polyethylene glycol powder (GLYCOLAX/MIRALAX) powder 255 grams one bottle for colonoscopy prep 255 g 0    No results found for this or any previous visit (from the past 7 hour(s)). No results found.  Review of Systems  Constitutional: Negative.   Respiratory: Negative.   Cardiovascular: Negative.   Gastrointestinal: Negative.   Genitourinary: Negative.     Blood pressure (!) 184/91, pulse 86, temperature (!) 96.9 F (36.1 C), temperature source Tympanic, height 5\' 7"  (1.702 m), weight 128 lb (58.1 kg), SpO2 100 %. Physical Exam  Constitutional: She is oriented to person, place, and time. She appears well-developed and well-nourished.  Eyes: Conjunctivae are normal. No scleral icterus.  Neck: Neck supple.  Cardiovascular: Normal rate and regular rhythm.  Respiratory: Effort normal and breath sounds normal.  GI: Soft.  Bowel sounds are normal. She exhibits no distension and no mass. There is no tenderness.  Lymphadenopathy:    She has no cervical adenopathy.  Neurological: She is alert and oriented to person, place, and time.  Skin: Skin is warm and dry.     Assessment/Plan Surveillance colonoscopy as scheduled.  Christene Lye, MD 10/24/2017, 8:45 AM

## 2017-10-24 NOTE — Op Note (Signed)
[facility] Gastroenterology Patient Name: Brittany Oneal Procedure Date: 10/24/2017 8:45 AM MRN: [id number] Account #: [id number] Date of Birth: 1930/12/17 Admit Type: Outpatient Age: 83 Room: [facility] ENDO ROOM 1 Gender: Female Note Status: Finalized Procedure:            Colonoscopy Indications:          High risk colon cancer surveillance: Personal history                        of colon cancer Providers:            Tarren Sabree G. Jamal Collin, MD Referring MD:         Olin Hauser (Referring MD) Medicines:            General Anesthesia Complications:        No immediate complications. Procedure:            Pre-Anesthesia Assessment:                       - General anesthesia under the supervision of an                        anesthesiologist was determined to be medically                        necessary for this procedure based on review of the                        patient's medical history, medications, and prior                        anesthesia history.                       After obtaining informed consent, the colonoscope was                        passed under direct vision. Throughout the procedure,                        the patient's blood pressure, pulse, and oxygen                        saturations were monitored continuously. The                        Colonoscope was introduced through the anus and                        advanced to the the cecum, identified by the ileocecal                        valve. The colonoscopy was performed without                        difficulty. The patient tolerated the procedure well.                        The quality of the bowel preparation was good. Findings:      The perianal and digital rectal examinations were normal.      A few small-mouthed  diverticula were found in the sigmoid colon.      The exam was otherwise without abnormality. Impression:           - Diverticulosis in the sigmoid colon.                   - The examination was otherwise normal.                       - No specimens collected. Recommendation:       - Discharge patient to home.                       - Resume previous diet.                       - Continue present medications. Procedure Code(s):    --- Professional ---                       [phone], Colonoscopy, flexible; diagnostic, including                        collection of specimen(s) by brushing or washing, when                        performed (separate procedure) Diagnosis Code(s):    --- Professional ---                       V89.381, Personal history of other malignant neoplasm                        of large intestine                       K57.30, Diverticulosis of large intestine without                        perforation or abscess without bleeding CPT copyright 2016 [employer]. All rights reserved. The codes documented in this report are preliminary and upon coder review may  be revised to meet current compliance requirements. Christene Lye, MD 10/24/2017 9:21:00 AM This report has been signed electronically. Number of Addenda: 0 Note Initiated On: 10/24/2017 8:45 AM Scope Withdrawal Time: 0 hours 5 minutes 53 seconds  Total Procedure Duration: 0 hours 20 minutes 26 seconds       [employer]

## 2017-10-24 NOTE — Interval H&P Note (Signed)
History and Physical Interval Note:  10/24/2017 8:48 AM  Brittany Oneal  has presented today for surgery, with the diagnosis of HX COLON CA  The various methods of treatment have been discussed with the patient and family. After consideration of risks, benefits and other options for treatment, the patient has consented to  Procedure(s): COLONOSCOPY WITH PROPOFOL (N/A) as a surgical intervention .  The patient's history has been reviewed, patient examined, no change in status, stable for surgery.  I have reviewed the patient's chart and labs.  Questions were answered to the patient's satisfaction.     SANKAR,SEEPLAPUTHUR G

## 2017-10-24 NOTE — Anesthesia Preprocedure Evaluation (Signed)
Anesthesia Evaluation  Patient identified by MRN, date of birth, ID band Patient awake    Reviewed: Allergy & Precautions, NPO status , Patient's Chart, lab work & pertinent test results  Airway Mallampati: II       Dental  (+) Teeth Intact   Pulmonary neg pulmonary ROS,    breath sounds clear to auscultation       Cardiovascular Exercise Tolerance: Poor hypertension, Pt. on medications  Rhythm:Regular Rate:Normal     Neuro/Psych negative neurological ROS  negative psych ROS   GI/Hepatic negative GI ROS, Neg liver ROS, Patient received Oral Contrast Agents,  Endo/Other  Hypothyroidism   Renal/GU negative Renal ROS     Musculoskeletal  (+) Arthritis ,   Abdominal Normal abdominal exam  (+)   Peds negative pediatric ROS (+)  Hematology   Anesthesia Other Findings   Reproductive/Obstetrics                             Anesthesia Physical Anesthesia Plan  ASA: II  Anesthesia Plan: General   Post-op Pain Management:    Induction: Intravenous  PONV Risk Score and Plan: 0  Airway Management Planned: Natural Airway and Nasal Cannula  Additional Equipment:   Intra-op Plan:   Post-operative Plan:   Informed Consent: I have reviewed the patients History and Physical, chart, labs and discussed the procedure including the risks, benefits and alternatives for the proposed anesthesia with the patient or authorized representative who has indicated his/her understanding and acceptance.     Plan Discussed with: Surgeon  Anesthesia Plan Comments:         Anesthesia Quick Evaluation

## 2017-10-24 NOTE — Transfer of Care (Signed)
Immediate Anesthesia Transfer of Care Note  Patient: Brittany Oneal  Procedure(s) Performed: COLONOSCOPY WITH PROPOFOL (N/A )  Patient Location: PACU  Anesthesia Type:General  Level of Consciousness: awake  Airway & Oxygen Therapy: Patient Spontanous Breathing and Patient connected to nasal cannula oxygen  Post-op Assessment: Report given to RN and Post -op Vital signs reviewed and stable  Post vital signs: Reviewed  Last Vitals:  Vitals:   10/24/17 0813  BP: (!) 184/91  Pulse: 86  Temp: (!) 36.1 C  SpO2: 100%    Last Pain:  Vitals:   10/24/17 0813  TempSrc: Tympanic         Complications: No apparent anesthesia complications

## 2017-10-24 NOTE — Anesthesia Post-op Follow-up Note (Signed)
Anesthesia QCDR form completed.        

## 2017-10-24 NOTE — Anesthesia Postprocedure Evaluation (Signed)
Anesthesia Post Note  Patient: Brittany Oneal  Procedure(s) Performed: COLONOSCOPY WITH PROPOFOL (N/A )  Patient location during evaluation: PACU Anesthesia Type: General Level of consciousness: awake Pain management: pain level controlled Vital Signs Assessment: post-procedure vital signs reviewed and stable Respiratory status: nonlabored ventilation Cardiovascular status: stable Anesthetic complications: no     Last Vitals:  Vitals:   10/24/17 0941 10/24/17 0951  BP: (!) 114/54 134/84  Pulse: 80 72  Resp: 14 15  Temp:    SpO2: 98% 99%    Last Pain:  Vitals:   10/24/17 0921  TempSrc: Tympanic  PainSc: Asleep                 VAN STAVEREN,Madiline Saffran

## 2017-10-25 ENCOUNTER — Encounter: Payer: Self-pay | Admitting: General Surgery

## 2018-01-01 ENCOUNTER — Other Ambulatory Visit: Payer: Medicare Other

## 2018-01-28 NOTE — Addendum Note (Signed)
Addended by: Thomes Lolling on: 01/28/2018 11:32 AM   Modules accepted: Orders

## 2018-01-28 NOTE — Addendum Note (Signed)
Addended by: Thomes Lolling on: 01/28/2018 11:33 AM   Modules accepted: Orders

## 2018-01-29 ENCOUNTER — Other Ambulatory Visit: Payer: Medicare Other

## 2018-01-29 DIAGNOSIS — E039 Hypothyroidism, unspecified: Secondary | ICD-10-CM

## 2018-01-29 DIAGNOSIS — E7800 Pure hypercholesterolemia, unspecified: Secondary | ICD-10-CM

## 2018-01-29 DIAGNOSIS — R7303 Prediabetes: Secondary | ICD-10-CM

## 2018-01-29 DIAGNOSIS — I10 Essential (primary) hypertension: Secondary | ICD-10-CM

## 2018-01-30 LAB — LIPID PANEL
CHOLESTEROL: 184 mg/dL (ref <200)
HDL: 74 mg/dL (ref >50)
LDL Cholesterol (Calc): 93 mg/dL (calc)
Non-HDL Cholesterol (Calc): 110 mg/dL (calc) (ref <130)
TRIGLYCERIDES: 80 mg/dL (ref <150)
Total CHOL/HDL Ratio: 2.5 (calc) (ref <5.0)

## 2018-01-30 LAB — COMPLETE METABOLIC PANEL WITH GFR
AG Ratio: 2 (calc) (ref 1.0–2.5)
ALKALINE PHOSPHATASE (APISO): 58 U/L (ref 33–130)
ALT: 9 U/L (ref 6–29)
AST: 17 U/L (ref 10–35)
Albumin: 4.3 g/dL (ref 3.6–5.1)
BUN/Creatinine Ratio: 22 (calc) (ref 6–22)
BUN: 21 mg/dL (ref 7–25)
CALCIUM: 9.4 mg/dL (ref 8.6–10.4)
CO2: 32 mmol/L (ref 20–32)
CREATININE: 0.94 mg/dL — AB (ref 0.60–0.88)
Chloride: 98 mmol/L (ref 98–110)
GFR, EST NON AFRICAN AMERICAN: 56 mL/min/{1.73_m2} — AB (ref >=60)
GFR, Est African American: 65 mL/min/{1.73_m2} (ref >=60)
GLOBULIN: 2.1 g/dL (ref 1.9–3.7)
GLUCOSE: 87 mg/dL (ref 65–99)
Potassium: 4.3 mmol/L (ref 3.5–5.3)
SODIUM: 136 mmol/L (ref 135–146)
Total Bilirubin: 0.8 mg/dL (ref 0.2–1.2)
Total Protein: 6.4 g/dL (ref 6.1–8.1)

## 2018-01-30 LAB — CBC WITH DIFFERENTIAL/PLATELET
BASOS ABS: 41 {cells}/uL (ref 0–200)
Basophils Relative: 0.9 %
EOS ABS: 143 {cells}/uL (ref 15–500)
EOS PCT: 3.1 %
HCT: 39.7 % (ref 35.0–45.0)
Hemoglobin: 13.3 g/dL (ref 11.7–15.5)
Lymphs Abs: 1822 cells/uL (ref 850–3900)
MCH: 28.6 pg (ref 27.0–33.0)
MCHC: 33.5 g/dL (ref 32.0–36.0)
MCV: 85.4 fL (ref 80.0–100.0)
MONOS PCT: 11.2 %
MPV: 10.6 fL (ref 7.5–12.5)
NEUTROS ABS: 2079 {cells}/uL (ref 1500–7800)
Neutrophils Relative %: 45.2 %
PLATELETS: 181 10*3/uL (ref 140–400)
RBC: 4.65 10*6/uL (ref 3.80–5.10)
RDW: 12.8 % (ref 11.0–15.0)
TOTAL LYMPHOCYTE: 39.6 %
WBC mixed population: 515 cells/uL (ref 200–950)
WBC: 4.6 10*3/uL (ref 3.8–10.8)

## 2018-01-30 LAB — HEMOGLOBIN A1C
EAG (MMOL/L): 6 (calc)
HEMOGLOBIN A1C: 5.4 %{Hb} (ref <5.7)
MEAN PLASMA GLUCOSE: 108 (calc)

## 2018-02-05 ENCOUNTER — Ambulatory Visit (INDEPENDENT_AMBULATORY_CARE_PROVIDER_SITE_OTHER): Payer: Medicare Other | Admitting: Family Medicine

## 2018-02-05 ENCOUNTER — Other Ambulatory Visit: Payer: Self-pay | Admitting: Family Medicine

## 2018-02-05 ENCOUNTER — Encounter: Payer: Self-pay | Admitting: Family Medicine

## 2018-02-05 ENCOUNTER — Ambulatory Visit: Payer: Medicare Other

## 2018-02-05 VITALS — BP 148/70 | HR 71 | Temp 97.7°F | Resp 16 | Ht 66.0 in | Wt 125.0 lb

## 2018-02-05 DIAGNOSIS — E7800 Pure hypercholesterolemia, unspecified: Secondary | ICD-10-CM

## 2018-02-05 DIAGNOSIS — I10 Essential (primary) hypertension: Secondary | ICD-10-CM | POA: Diagnosis not present

## 2018-02-05 DIAGNOSIS — R7303 Prediabetes: Secondary | ICD-10-CM

## 2018-02-05 DIAGNOSIS — J300 Vasomotor rhinitis: Secondary | ICD-10-CM

## 2018-02-05 DIAGNOSIS — Z0000 Encounter for general adult medical examination without abnormal findings: Secondary | ICD-10-CM

## 2018-02-05 DIAGNOSIS — E039 Hypothyroidism, unspecified: Secondary | ICD-10-CM

## 2018-02-05 DIAGNOSIS — S0452XA Injury of facial nerve, left side, initial encounter: Secondary | ICD-10-CM | POA: Insufficient documentation

## 2018-02-05 DIAGNOSIS — K117 Disturbances of salivary secretion: Secondary | ICD-10-CM

## 2018-02-05 DIAGNOSIS — S0452XS Injury of facial nerve, left side, sequela: Secondary | ICD-10-CM

## 2018-02-05 DIAGNOSIS — R7989 Other specified abnormal findings of blood chemistry: Secondary | ICD-10-CM

## 2018-02-05 MED ORDER — IPRATROPIUM BROMIDE 0.06 % NA SOLN: 2.0000 | Freq: Four times a day (QID) | NASAL | 1 refills | Status: DC

## 2018-02-05 NOTE — Patient Instructions (Addendum)
Thank you for coming to the office today.  1.  Mild elevated Creatinine, can be due to slight reduced kidney function Recommend increased water intake to help kidneys, especially hydrate well before a future lab test, day before and day of with water  For BP - it is slightly elevated today, but improved overall. I think readings are stable  Continue Lisinopril 20mg  (HALF tab of the 40mg ) for now in morning only - can remain off PM dose  Keep closer watch on your BP at home, if consistently >140 then notify office sooner, and we can adjust med - may need to start Lisinopril 30mg  daily instead or we can resume the twice daily dosing on 20mg    For nasal drainage  Start Atrovent nasal spray decongestant 2 sprays in affected nostril as needed if you get drip or drain, then may use it as needed every few days to help prevent the problem  May stop Flonase for now if not working  We will try to add the TSH Thyroid lab as well, if we cannot, then will need blood work in about 3 months and will re-check kidney  Recommend going to pharmacy to check on status of Shingles Vaccine (Shingrix) 2 injections about 2-6 months apart  Please schedule a Follow-up Appointment to: Return in about 3 months (around 05/08/2018) for Thyroid, BP, and lab review.  If you have any other questions or concerns, please feel free to call the office or send a message through [place]. You may also schedule an earlier appointment if necessary.  Additionally, you may be receiving a survey about your experience at our office within a few days to 1 week by e-mail or mail. We value your feedback.  [name], DO [place]

## 2018-02-05 NOTE — Assessment & Plan Note (Addendum)
Again initially elevated BP, repeat manual check improved but not quite normal range Home BP readings - relatively controlled, occasionally some higher readings now SBP 140, on avg controlled on lower dose Lisinopril Complicated with mild elevated Creatinine now 0.95, with GFR < 60 History of hypotension / syncope     Plan:  1. Continue current BP med - Lisinopril 20mg  daily in AM (REMAIN OFF 20mg  PM DOSE) 2. Encourage improved lifestyle - continue low sodium diet, regular exercise 3. Continue close monitor BP outside office 1-2x weekly, bring readings, concern hypotension or inc BP with lower med 4. Follow-up 3 months with repeat labs to follow Cr / GFR trend - if BP still on avg 130s to 140 may consider inc Lisinopril to 30mg  x 1 daily or may resume 20mg  BID - Improve hydration before next lab draw

## 2018-02-05 NOTE — Assessment & Plan Note (Signed)
Controlled cholesterol on lifestyle Last lipid panel 01/2018, improved, had previously mild elevated LDL, but very good HDL Calculated ASCVD 10 yr risk score still >30%, mainly d/t age  Plan: 1. Continue current meds - Fish Oil daily - Continue ASA 81mg  daily for primary prevention 2. Briefly reviewed discussion again on future ASCVD risk and prevention, no prior CAD/MI/CVA - offered statin therapy - reviewed this and possible starting dose intermittent Rosuvastatin, but mutual decision agree to hold off for now and can add later 3. Encourage improved lifestyle - continue current diet and exercise 4. Follow-up q 12 months

## 2018-02-05 NOTE — Assessment & Plan Note (Addendum)
Asymptomatic Previously stable and controlled Hypothyroidism on current dose Levothyroxine 35mcg daily Did not check TSH this time with labs 01/2018, will need to return for blood work, time passed is too long to add lab tests now Return 3 months lab only will check TSH, Free T4, adjust accordingly, then check yearly

## 2018-02-05 NOTE — Progress Notes (Signed)
Subjective:    Patient ID: Brittany Oneal, female    DOB: Oct 22, 1931, 84 y.o.   MRN: [id number]  Brittany Oneal is a 84 y.o. female presenting on 02/05/2018 for Annual Exam; Hypertension; and Hypothyroidism   HPI   Here for Annual Physical and Lab Review  CHRONIC HTN: - Last visit with me 07/2017, for HTN, treated with REDUCED dose Lisinopril from 20mg  BID down to 20mg  dailiy in AM, still cutting 40mg  tab in half for now, see prior notes for background information. - Today patient reports that her home readings have been fairly well controlled on lower dose (avg 120-130 / 80, but occasional 140s), but has some occasional higher readings SBP in 140s, today mild elevated as well Current Meds - Lisinopril 40mg  - takes HALF dose 20mg  in AM only - last time we discontinued 20mg  in PM Reports good compliance, took meds today. 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 Not was well hydrated before labs or in general, she admits needs to drink more water  Hypothyroidism, chronic Stable chronic problem, still taking Levothyroxine 102mcg daily. Last normal TSH trend 2017 and 2018 normal, did not check it by accident with recent labs now 01/2018, she will return to have blood work in near future for Thyroid. History when she was diagnosed with hypothyroid years ago she had presenting symptom of fatigue and tired She denies any symptoms of low thyroid hormone at this time  Pre-Diabetes: Reports no concerns, had prior elevated A1c >3 years ago, now recent readings stable. Meds: Never on medication for blood sugar Currently on ACEi Lifestyle: - Weight is relatively stable, down 2 lbs in a 5-6 months - Diet (Still focusing on healthy diet, avoiding sweets, eats some smaller portions but now improving appetite, drinks mostly water, some tea, coffee)  - Exercise (Regular  exercises at gym 2x weekly, works with trainer for weight machines and cardio treadmill, stationary bicycle) - Family history of DM2 (Father) Denies hypoglycemia  Mild Elevated LDL - Reports no concerns. Last lipid panel 01/2018, controlled lipids with normal LDL and HDL, low TG - Currently taking Fish Oil omega 3 1g daily - No known history of CAD, MI, CVA - She has been taking ASA 81mg  daily since last visit, doing well without bleeding or other episodes - She has never been on Statin medicine, has declined this in the past  Additional complaint: - Sinus drainage and drip, R nostril only, reports intermittent episodes of some thin yellow liquid about 3-4 drops come out promptly if tilt head forward sometimes, usually self limited and stops then, does not happen every time, maybe now once every 1-2 weeks, no pattern and non seasonal - She has used Flonase before, but may not be effective - No longer on Loratadine, caused a nose bleed  Chronic L Facial palsy / Drooling - reports this problem bothers her frequently, has not tried anything for it. 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  Health Maintenance: UTD DEXA, 04/2016 - T score normal, without history of osteopenia / osteoporosis UTD PNA vaccine series, completed UTD Flu Vaccine 07/2017 UTD TDap 2015  S/p Zostavax. She may get Shingrix 2 series injection at retail pharmacy whenever ready, should check cost coverage  Depression screen [facility] 2/9 02/05/2018 07/17/2017 11/28/2016  Decreased Interest 0 0 0  Down, Depressed, Hopeless 0  0 0  PHQ - 2 Score 0 0 0    Past Medical History:  Diagnosis Date  . Arthritis   . Family history of malignant neoplasm of breast   . Family history of malignant neoplasm of gastrointestinal tract   . History of colon cancer   . Personal history of malignant neoplasm of large intestine   . Special screening for malignant neoplasms, colon   . Thyroid disease   . Unspecified  essential hypertension    Past Surgical History:  Procedure Laterality Date  . ABDOMINAL HYSTERECTOMY  1983  . COLON SURGERY  1990   resection d/t cancer  . COLONOSCOPY  1997,2001,2004,2006, 2011  . COLONOSCOPY WITH PROPOFOL N/A 10/24/2017   Procedure: COLONOSCOPY WITH PROPOFOL;  Surgeon: Christene Lye, MD;  Location: [facility] ENDOSCOPY;  Service: Endoscopy;  Laterality: N/A;  . EYE SURGERY  Sept 2013  . 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 SALPINGOOPHORECTOMY     Social History   Socioeconomic History  . Marital status: Married    Spouse name: Not on file  . Number of children: Not on file  . Years of education: Not on file  . Highest education level: Not on file  Social Needs  . Financial resource strain: Not on file  . Food insecurity - worry: Not on file  . Food insecurity - inability: Not on file  . Transportation needs - medical: Not on file  . Transportation needs - non-medical: Not on file  Occupational History  . Not on file  Tobacco Use  . Smoking status: Never Smoker  . Smokeless tobacco: Never Used  Substance and Sexual Activity  . Alcohol use: No    Alcohol/week: 0.0 oz  . Drug use: No  . Sexual activity: Not on file  Other Topics Concern  . Not on file  Social History Narrative  . Not on file   Family History  Problem Relation Age of Onset  . Hypertension Mother   . Hypertension Father   . Diabetes Father   . Cancer Other        FH of breast and colon cancer  . Cancer Brother        colon  . Cancer Sister        breast   Current Outpatient Medications on File Prior to Visit  Medication Sig  . aspirin EC 81 MG tablet Take 1 tablet (81 mg total) by mouth daily.  . calcium carbonate (OS-CAL - DOSED IN MG OF ELEMENTAL CALCIUM) 1250 MG tablet Take 1 tablet by mouth.  . Glucosamine-Chondroitin (GLUCOSAMINE CHONDR COMPLEX) 500-400 MG CAPS Take 2 capsules by mouth daily.  Marland Kitchen levothyroxine (SYNTHROID, LEVOTHROID) 50 MCG tablet  Take 1 tablet (50 mcg total) by mouth daily.  Marland Kitchen lisinopril (PRINIVIL,ZESTRIL) 40 MG tablet Take 0.5 tablets (20 mg total) by mouth daily. In AM  . Omega-3 Fatty Acids (FISH OIL) 1000 MG CAPS Take 1 capsule by mouth daily.   . polyethylene glycol powder (GLYCOLAX/MIRALAX) powder 255 grams one bottle for colonoscopy prep   No current facility-administered medications on file prior to visit.     Review of Systems  Constitutional: Negative for activity change, appetite change, chills, diaphoresis, fatigue, fever and unexpected weight change.  HENT: Positive for drooling (L side). Negative for congestion, hearing loss, sinus pressure and trouble swallowing.   Eyes: Negative for discharge and visual disturbance.  Respiratory: Negative for apnea, cough, chest tightness, shortness of breath and wheezing.  Cardiovascular: Negative for chest pain, palpitations and leg swelling.  Gastrointestinal: Negative for abdominal pain, anal bleeding, blood in stool, constipation, diarrhea, nausea and vomiting.  Endocrine: Negative for cold intolerance and polyuria.  Genitourinary: Negative for difficulty urinating, dysuria, frequency and hematuria.  Musculoskeletal: Negative for arthralgias, back pain and neck pain.  Skin: Negative for rash.  Allergic/Immunologic: Positive for environmental allergies.  Neurological: Positive for facial asymmetry (Chronic L sided facial palsy, facial nerve injury). Negative for dizziness, weakness, light-headedness, numbness and headaches.  Hematological: Negative for adenopathy.  Psychiatric/Behavioral: Negative for behavioral problems, dysphoric mood and sleep disturbance. The patient is not nervous/anxious.    Per HPI unless specifically indicated above     Objective:    BP (!) 148/70 (BP Location: Left Arm, Cuff Size: Normal)   Pulse 71   Temp 97.7 F (36.5 C) (Oral)   Resp 16   Ht 5\' 6"  (1.676 m)   Wt 125 lb (56.7 kg)   BMI 20.18 kg/m   Wt Readings from Last 3  Encounters:  02/05/18 125 lb (56.7 kg)  10/24/17 128 lb (58.1 kg)  09/05/17 127 lb (57.6 kg)    Physical Exam  Constitutional: She is oriented to person, place, and time. She appears well-developed and well-nourished. No distress.  Well-appearing thin 84 yr old female, comfortable, cooperative  HENT:  Head: Normocephalic and atraumatic.  Mouth/Throat: Oropharynx is clear and moist.  Frontal / maxillary sinuses non-tender. Nares patent without purulence or edema. Bilateral TMs clear without erythema, effusion or bulging. Oropharynx clear without erythema, exudates, edema or asymmetry.  Eyes: Conjunctivae and EOM are normal. Pupils are equal, round, and reactive to light. Right eye exhibits no discharge. Left eye exhibits no discharge.  Neck: Normal range of motion. Neck supple. No thyromegaly present.  Cardiovascular: Normal rate, regular rhythm, normal heart sounds and intact distal pulses.  No murmur heard. Pulmonary/Chest: Effort normal and breath sounds normal. No respiratory distress. She has no wheezes. She has no rales.  Abdominal: Soft. Bowel sounds are normal. She exhibits no distension and no mass. There is no tenderness.  Musculoskeletal: Normal range of motion. She exhibits no edema or tenderness.  Upper / Lower Extremities: - Normal muscle tone, strength bilateral upper extremities 5/5, lower extremities 5/5  Lymphadenopathy:    She has no cervical adenopathy.  Neurological: She is alert and oriented to person, place, and time.  Distal sensation intact to light touch all extremities  Chronic, L side of face with droop and muscle palsy from facial nerve injury  Skin: Skin is warm and dry. No rash noted. She is not diaphoretic. No erythema.  Psychiatric: She has a normal mood and affect. Her behavior is normal.  Well groomed, good eye contact, normal speech and thoughts  Nursing note and vitals reviewed.  Results for orders placed or performed in visit on 01/29/18  COMPLETE  METABOLIC PANEL WITH GFR  Result Value Ref Range   Glucose, Bld 87 65 - 99 mg/dL   BUN 21 7 - 25 mg/dL   Creat 0.94 (H) 0.60 - 0.88 mg/dL   GFR, Est Non African American 56 (L) > OR = 60 mL/min/1.64m2   GFR, Est African American 65 > OR = 60 mL/min/1.66m2   BUN/Creatinine Ratio 22 6 - 22 (calc)   Sodium 136 135 - 146 mmol/L   Potassium 4.3 3.5 - 5.3 mmol/L   Chloride 98 98 - 110 mmol/L   CO2 32 20 - 32 mmol/L   Calcium 9.4 8.6 -  10.4 mg/dL   Total Protein 6.4 6.1 - 8.1 g/dL   Albumin 4.3 3.6 - 5.1 g/dL   Globulin 2.1 1.9 - 3.7 g/dL (calc)   AG Ratio 2.0 1.0 - 2.5 (calc)   Total Bilirubin 0.8 0.2 - 1.2 mg/dL   Alkaline phosphatase (APISO) 58 33 - 130 U/L   AST 17 10 - 35 U/L   ALT 9 6 - 29 U/L  CBC with Differential/Platelet  Result Value Ref Range   WBC 4.6 3.8 - 10.8 Thousand/uL   RBC 4.65 3.80 - 5.10 Million/uL   Hemoglobin 13.3 11.7 - 15.5 g/dL   HCT 39.7 35.0 - 45.0 %   MCV 85.4 80.0 - 100.0 fL   MCH 28.6 27.0 - 33.0 pg   MCHC 33.5 32.0 - 36.0 g/dL   RDW 12.8 11.0 - 15.0 %   Platelets 181 140 - 400 Thousand/uL   MPV 10.6 7.5 - 12.5 fL   Neutro Abs 2,079 1,500 - 7,800 cells/uL   Lymphs Abs 1,822 850 - 3,900 cells/uL   WBC mixed population 515 200 - 950 cells/uL   Eosinophils Absolute 143 15 - 500 cells/uL   Basophils Absolute 41 0 - 200 cells/uL   Neutrophils Relative % 45.2 %   Total Lymphocyte 39.6 %   Monocytes Relative 11.2 %   Eosinophils Relative 3.1 %   Basophils Relative 0.9 %  Hemoglobin A1c  Result Value Ref Range   Hgb A1c MFr Bld 5.4 <5.7 % of total Hgb   Mean Plasma Glucose 108 (calc)   eAG (mmol/L) 6.0 (calc)  Lipid panel  Result Value Ref Range   Cholesterol 184 <200 mg/dL   HDL 74 >50 mg/dL   Triglycerides 80 <150 mg/dL   LDL Cholesterol (Calc) 93 mg/dL (calc)   Total CHOL/HDL Ratio 2.5 <5.0 (calc)   Non-HDL Cholesterol (Calc) 110 <130 mg/dL (calc)      Assessment & Plan:   Problem List Items Addressed This Visit    Elevated LDL  cholesterol level    Controlled cholesterol on lifestyle Last lipid panel 01/2018, improved, had previously mild elevated LDL, but very good HDL Calculated ASCVD 10 yr risk score still >30%, mainly d/t age  Plan: 1. Continue current meds - Fish Oil daily - Continue ASA 81mg  daily for primary prevention 2. Briefly reviewed discussion again on future ASCVD risk and prevention, no prior CAD/MI/CVA - offered statin therapy - reviewed this and possible starting dose intermittent Rosuvastatin, but mutual decision agree to hold off for now and can add later 3. Encourage improved lifestyle - continue current diet and exercise 4. Follow-up q 12 months      Hypertension (Chronic)    Again initially elevated BP, repeat manual check improved but not quite normal range Home BP readings - relatively controlled, occasionally some higher readings now SBP 140, on avg controlled on lower dose Lisinopril Complicated with mild elevated Creatinine now 0.95, with GFR < 60 History of hypotension / syncope     Plan:  1. Continue current BP med - Lisinopril 20mg  daily in AM (REMAIN OFF 20mg  PM DOSE) 2. Encourage improved lifestyle - continue low sodium diet, regular exercise 3. Continue close monitor BP outside office 1-2x weekly, bring readings, concern hypotension or inc BP with lower med 4. Follow-up 3 months with repeat labs to follow Cr / GFR trend - if BP still on avg 130s to 140 may consider inc Lisinopril to 30mg  x 1 daily or may resume 20mg  BID - Improve  hydration before next lab draw      Relevant Medications   lisinopril (PRINIVIL,ZESTRIL) 40 MG tablet   Hypothyroidism (Chronic)    Asymptomatic Previously stable and controlled Hypothyroidism on current dose Levothyroxine 25mcg daily Did not check TSH this time with labs 01/2018, will need to return for blood work, time passed is too long to add lab tests now Return 3 months lab only will check TSH, Free T4, adjust accordingly, then check yearly        Injury of left facial nerve   Pre-diabetes    Well-controlled Pre-DM with A1c 5.4 (prior 5.2) Concern with HTN  Plan:  1. Not on any therapy currently  2. Encourage improved lifestyle - low carb, low sugar diet - given BMI 20 and prior wt loss, encouraged to continue higher protein diet, should not limit calories based on concern for Pre-DM. Continue regular exercise 3. Follow-up q 12 months for A1c, defer lab at next visit, may consider in 6 months from now if needed       Other Visit Diagnoses    Annual physical exam    -  Primary UTD Health maintenance, reviewed Reviewed labs Encouraged continue improving healthy lifestyle diet/exercise    Vasomotor rhinitis       Mild sinus drip intermittent, no other risk factors or concern, trial on Atrovent PRN for possible vasomotor rhinitis, avoid triggers   Relevant Medications   ipratropium (ATROVENT) 0.06 % nasal spray   Drooling       Likely secondary to chronic L facial nerve injury, advised may try lozenge or hard candy / mint to try to control secretions better with swallowing the secretions rather than spontaneous controlling of secretions with inc risk of accidents or drooling      Meds ordered this encounter  Medications  . ipratropium (ATROVENT) 0.06 % nasal spray    Sig: Place 2 sprays into both nostrils 4 (four) times daily. Use when having acute sinus drainage, or may use intermittent to prevent problem    Dispense:  15 mL    Refill:  1    Follow up plan: Return in about 3 months (around 05/08/2018) for Thyroid, BP, and lab review.  Future labs ordered for 05/07/18.  [name], [facility] 02/05/2018, 12:50 PM

## 2018-02-05 NOTE — Assessment & Plan Note (Signed)
Well-controlled Pre-DM with A1c 5.4 (prior 5.2) Concern with HTN  Plan:  1. Not on any therapy currently  2. Encourage improved lifestyle - low carb, low sugar diet - given BMI 20 and prior wt loss, encouraged to continue higher protein diet, should not limit calories based on concern for Pre-DM. Continue regular exercise 3. Follow-up q 12 months for A1c, defer lab at next visit, may consider in 6 months from now if needed

## 2018-03-13 ENCOUNTER — Other Ambulatory Visit: Payer: Self-pay | Admitting: Family Medicine

## 2018-03-13 DIAGNOSIS — J300 Vasomotor rhinitis: Secondary | ICD-10-CM

## 2018-03-31 ENCOUNTER — Other Ambulatory Visit: Payer: Self-pay | Admitting: Family Medicine

## 2018-03-31 DIAGNOSIS — E039 Hypothyroidism, unspecified: Secondary | ICD-10-CM

## 2018-03-31 DIAGNOSIS — I10 Essential (primary) hypertension: Secondary | ICD-10-CM

## 2018-05-07 ENCOUNTER — Other Ambulatory Visit: Payer: Medicare Other

## 2018-05-07 DIAGNOSIS — I10 Essential (primary) hypertension: Secondary | ICD-10-CM

## 2018-05-07 DIAGNOSIS — R7989 Other specified abnormal findings of blood chemistry: Secondary | ICD-10-CM

## 2018-05-07 DIAGNOSIS — E039 Hypothyroidism, unspecified: Secondary | ICD-10-CM

## 2018-05-08 LAB — BASIC METABOLIC PANEL WITHOUT GFR
BUN: 22 mg/dL (ref 7–25)
CO2: 29 mmol/L (ref 20–32)
Calcium: 9 mg/dL (ref 8.6–10.4)
Chloride: 100 mmol/L (ref 98–110)
Creat: 0.83 mg/dL (ref 0.60–0.88)
GFR, Est African American: 75 mL/min/1.73m2
GFR, Est Non African American: 65 mL/min/1.73m2
Glucose, Bld: 77 mg/dL (ref 65–99)
Potassium: 4.2 mmol/L (ref 3.5–5.3)
Sodium: 137 mmol/L (ref 135–146)

## 2018-05-08 LAB — TSH: TSH: 2.25 m[IU]/L (ref 0.40–4.50)

## 2018-05-08 LAB — T4, FREE: FREE T4: 1.1 ng/dL (ref 0.8–1.8)

## 2018-05-14 ENCOUNTER — Encounter: Payer: Self-pay | Admitting: Family Medicine

## 2018-05-14 ENCOUNTER — Other Ambulatory Visit: Payer: Self-pay

## 2018-05-14 ENCOUNTER — Ambulatory Visit (INDEPENDENT_AMBULATORY_CARE_PROVIDER_SITE_OTHER): Payer: Medicare Other | Admitting: Family Medicine

## 2018-05-14 VITALS — BP 160/88 | HR 80 | Temp 98.1°F | Ht 66.0 in | Wt 125.0 lb

## 2018-05-14 DIAGNOSIS — R3915 Urgency of urination: Secondary | ICD-10-CM | POA: Diagnosis not present

## 2018-05-14 DIAGNOSIS — R350 Frequency of micturition: Secondary | ICD-10-CM

## 2018-05-14 DIAGNOSIS — I10 Essential (primary) hypertension: Secondary | ICD-10-CM | POA: Diagnosis not present

## 2018-05-14 DIAGNOSIS — N3001 Acute cystitis with hematuria: Secondary | ICD-10-CM | POA: Diagnosis not present

## 2018-05-14 DIAGNOSIS — E039 Hypothyroidism, unspecified: Secondary | ICD-10-CM

## 2018-05-14 LAB — POCT URINALYSIS DIPSTICK
BILIRUBIN UA: NEGATIVE
Glucose, UA: NEGATIVE
KETONES UA: NEGATIVE
NITRITE UA: NEGATIVE
PH UA: 5 (ref 5.0–8.0)
PROTEIN UA: NEGATIVE
SPEC GRAV UA: 1.01 (ref 1.010–1.025)
UROBILINOGEN UA: 0.2 U/dL

## 2018-05-14 MED ORDER — CEPHALEXIN 500 MG PO CAPS: 500.0000 mg | ORAL_CAPSULE | Freq: Three times a day (TID) | ORAL | 0 refills | Status: DC

## 2018-05-14 NOTE — Patient Instructions (Addendum)
Thank you for coming to the office today.  1. You have a Urinary Tract Infection - this is very common, your symptoms are reassuring and you should get better within 1 week on the antibiotics - Start Keflex 500mg  3 times daily for next 7 days, complete entire course, even if feeling better - We sent urine for a culture, we will call you within next few days if we need to change antibiotics - Please drink plenty of fluids, improve hydration over next 1 week  If symptoms worsening, developing nausea / vomiting, worsening back pain, fevers / chills / sweats, then please return for re-evaluation sooner.  To prevent bladder and kidney infections...  Wipe front to back after using the restroom  Drink enough water to keep your pee clear to pale yellow  If you think you are getting another bladder infection, start drinking cranberry juice and come see [place] so we can check the urine.  ----- Thyroid lab was normal - continue current dose Levothyroxine 21mcg daily  BP was still elevated today but recheck slightly better - may be due to anxiety and urinary tract infection  Keep checking BP at home, bring readings to next visit  BRING CUFF TO NEXT VISIT  CONTINUE lisinopril half pill 20mg  in morning - do not take evening pill - unless needed can call us if question  If acute worsening headache, vision change or loss, pressure, chest pain short of breath near passing out or lightheadedness call 911 and go to hospital for further evaluation   Please schedule a Follow-up Appointment to: Return in about 2 weeks (around 05/28/2018) for HTN med adjust, f/u UTI.  If you have any other questions or concerns, please feel free to call the office or send a message through [place]. You may also schedule an earlier appointment if necessary.  Additionally, you may be receiving a survey about your experience at our office within a few days to 1 week by e-mail or mail. We value your feedback.  [name], DO [place]

## 2018-05-14 NOTE — Progress Notes (Addendum)
Subjective:    Patient ID: Brittany Oneal, female    DOB: 02-Dec-1930, 84 y.o.   MRN: [id number]  Brittany Oneal is a 84 y.o. female presenting on 05/14/2018 for Hypertension and Urinary Frequency (urinary urgency, abdominal discomfort x 1 day )   HPI   CHRONIC HTN: Last visit with me 01/2018 for annual physical followed HTN, initial change to med was 07/2017 with taking Lisinopril 40mg  tab half tab 20mg  AM and half PM at that time this was reduced to only 20mg  AM only, and she did well for a while., see prior notes for background information. - Interval update - has not checked BP as regular, but in past she had good readings on home cuff 120-130 / 80s, now has readings for past 2 days with very good numbers avg 130/70  - Today patient reports no new concerns with blood pressure or new symptoms, she was surprised with elevated reading today in office. Current Meds - Lisinopril 40mg  - takes HALF dose 20mg  in AM only - has an abundance of pills of this now does not need new rx Reports good compliance, took meds today. Tolerating well, w/o complaints - Taking ASA 81mg  daily tolerating well Denies CP, dyspnea, HA, edema, dizziness / lightheadedness  UTI / Urinary Frequency / Urgency Reports new onset 2 days of urinary frequency and urgency, trying to stay hydrated. 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 Improved her hydration Last lab result showed improved Cr down to 0.83, normalized, without significant concerns now.  Hypothyroidism, chronic Last visit 01/2018 - with continue dose, she had repeat labs done 04/2018 showed normal TSH and Free T4 on current dose Still taking Levothyroxine 48mcg daily No concerns at this  time Denies temperature instability, fever chills swelling   Depression screen [facility] 2/9 02/05/2018 07/17/2017 11/28/2016  Decreased Interest 0 0 0  Down, Depressed, Hopeless 0 0 0  PHQ - 2 Score 0 0 0    Social History   Tobacco Use  . Smoking status: Never Smoker  . Smokeless tobacco: Never Used  Substance Use Topics  . Alcohol use: No    Alcohol/week: 0.0 oz  . Drug use: No    Review of Systems Per HPI unless specifically indicated above     Objective:    BP (!) 160/88 (BP Location: Left Arm, Cuff Size: Normal)   Pulse 80   Temp 98.1 F (36.7 C) (Oral)   Ht 5\' 6"  (1.676 m)   Wt 125 lb (56.7 kg)   BMI 20.18 kg/m   Wt Readings from Last 3 Encounters:  05/14/18 125 lb (56.7 kg)  02/05/18 125 lb (56.7 kg)  10/24/17 128 lb (58.1 kg)    Physical Exam  Constitutional: She is oriented to person, place, and time. She appears well-developed and well-nourished. No distress.  Well-appearing, comfortable, cooperative  HENT:  Head: Normocephalic and atraumatic.  Mouth/Throat: Oropharynx is clear and moist.  Eyes: Conjunctivae are normal. Right eye exhibits no discharge. Left eye exhibits no discharge.  Neck: Normal range of motion. Neck supple. No thyromegaly present.  Cardiovascular: Normal rate, regular rhythm, normal heart sounds and intact distal pulses.  No murmur heard. Pulmonary/Chest: Effort normal and breath sounds normal. No respiratory distress.  She has no wheezes. She has no rales.  Abdominal: Soft. Bowel sounds are normal. She exhibits no distension. There is no tenderness. There is no guarding.  Musculoskeletal: Normal range of motion. She exhibits no edema.  Lymphadenopathy:    She has no cervical adenopathy.  Neurological: She is alert and oriented to person, place, and time.  Skin: Skin is warm and dry. No rash noted. She is not diaphoretic. No erythema.  Psychiatric: Her behavior is normal.  Well groomed, good eye contact, normal speech and thoughts    Nursing note and vitals reviewed.  Results for orders placed or performed in visit on 05/14/18  POCT Urinalysis Dipstick  Result Value Ref Range   Color, UA yellow    Clarity, UA clear    Glucose, UA Negative Negative   Bilirubin, UA neg    Ketones, UA neg    Spec Grav, UA 1.010 1.010 - 1.025   Blood, UA trace    pH, UA 5.0 5.0 - 8.0   Protein, UA Negative Negative   Urobilinogen, UA 0.2 0.2 or 1.0 E.U./dL   Nitrite, UA negative    Leukocytes, UA Small (1+) (A) Negative   Appearance     Odor        Assessment & Plan:   Problem List Items Addressed This Visit    Hypertension (Chronic)    Asymptomatic Acutely elevated BP today on electronic cuff and improved but still elevated on manual Inconsistent with her home readings that seem to be in normal range (does not have cuff with her) Resolved elevated creatinine No other known complication History of hypotension / syncope  Additionally BP may be elevated due to possible UTI  Plan:  1. Continue current BP med - Lisinopril 20mg  daily in AM only - she was given option to increase back to HALF pill TWICE DAILY if needed for elevated BP - I advised I am unsure which is best route for her at this time, and it would depend on her BP trend and also calibration of her cuff at home 2. Encourage improved lifestyle - continue low sodium diet, regular exercise 3. Continue close monitor BP outside office 1-2x weekly, bring readings, concern hypotension in past 4. Follow-up 2 weeks for HTN med adjust - may consider Lisinopril 30mg  daily vs back to 20mg  BID  Very strict return criteria given to patient if any acute worsening symptoms of HTN when to call or come back or go to hospital      Hypothyroidism (Chronic)    Asymptomatic Stable, controlled Last lab 04/2018 TSH and Free T4 normal on current dose Continue Levothyroxine 39mcg daily       Other Visit Diagnoses    Acute cystitis with hematuria    -  Primary  Clinically  consistent with UTI and suggestive UA. No recent UTIs or abx courses.  No obvious risk factors to trigger UTI No concern for pyelo today (no systemic symptoms, neg fever, back pain, n/v).  Plan: 1. Ordered Urine culture - will f/u results and call patient 2. Start Keflex 500mg  TID x 7 days 3. Keep Improve PO hydration 4. RTC if no improvement 1-2 weeks, red flags given to return sooner     Relevant Medications   cephALEXin (KEFLEX) 500 MG capsule   Other Relevant Orders   Urine Culture   Urinary frequency       Relevant Orders   POCT Urinalysis Dipstick (Completed)   Urinary urgency       Relevant  Orders   POCT Urinalysis Dipstick (Completed)      Meds ordered this encounter  Medications  . cephALEXin (KEFLEX) 500 MG capsule    Sig: Take 1 capsule (500 mg total) by mouth 3 (three) times daily. For 7 days    Dispense:  21 capsule    Refill:  0    Follow up plan: Return in about 2 weeks (around 05/28/2018) for HTN med adjust, f/u UTI.  [name], [facility] 05/14/2018, 1:37 PM

## 2018-05-14 NOTE — Assessment & Plan Note (Signed)
Acutely elevated BP today on electronic cuff and improved but still elevated on manual Inconsistent with her home readings that seem to be in normal range (does not have cuff with her) Resolved elevated creatinine No other known complication History of hypotension / syncope  Plan:  1. Continue current BP med - Lisinopril 20mg  daily in AM only - she was given option to increase back to HALF pill TWICE DAILY if needed for elevated BP - I advised I am unsure which is best route for her at this time, and it would depend on her BP trend and also calibration of her cuff at home 2. Encourage improved lifestyle - continue low sodium diet, regular exercise 3. Continue close monitor BP outside office 1-2x weekly, bring readings, concern hypotension in past 4. Follow-up 2 weeks for HTN med adjust - may consider Lisinopril 30mg  daily vs back to 20mg  BID

## 2018-05-14 NOTE — Assessment & Plan Note (Signed)
Asymptomatic Stable, controlled Last lab 04/2018 TSH and Free T4 normal on current dose Continue Levothyroxine 71mcg daily

## 2018-05-17 LAB — URINE CULTURE
MICRO NUMBER: 90728442
SPECIMEN QUALITY:: ADEQUATE

## 2018-05-27 ENCOUNTER — Encounter: Payer: Self-pay | Admitting: Family Medicine

## 2018-05-27 ENCOUNTER — Ambulatory Visit (INDEPENDENT_AMBULATORY_CARE_PROVIDER_SITE_OTHER): Payer: Medicare Other | Admitting: Family Medicine

## 2018-05-27 VITALS — BP 164/88 | HR 72 | Temp 97.7°F | Resp 16 | Ht 66.0 in | Wt 124.6 lb

## 2018-05-27 DIAGNOSIS — I10 Essential (primary) hypertension: Secondary | ICD-10-CM | POA: Diagnosis not present

## 2018-05-27 NOTE — Progress Notes (Signed)
Subjective:    Patient ID: Brittany Oneal, female    DOB: Apr 05, 1931, 84 y.o.   MRN: [id number]  Brittany Oneal is a 84 y.o. female presenting on 05/27/2018 for Hypertension and Urinary Tract Infection (improved)   HPI   CHRONIC HTN: Last visit with me 04/2018, for monitoring HTN, she had elevated BP, question if taking lisinopril 20mg  daily in AM was effective or should return to 40mg  (half 20mg  BID). In past she has failed Amlodipine due to swelling, and has tried Maxzide (Triamterene-HCTZ back in [id number], uncertain why stopped)  - Today patient reports her home BP monitoring, [employer] arm electronic cuff has shown normal results essentially, she has detailed BP log with her today, avg 104-110 up to 120s SBP and 62-80 DBP avg, only high reading was in our office SBP >160. Seems to have well documented white coat hypertension with elevated readings.  Current Meds - Lisinopril 40mg  - takes HALF dose 20mg  in [facility] - has an abundance of pills of this now does not need new rx Reports good compliance, took meds today. Tolerating well, w/o complaints - Taking ASA 81mg  daily tolerating well Denies CP, dyspnea, HA, edema, dizziness / lightheadedness  Follow-up UTI Last seen 05/14/18 for UTI, consistent symptoms, she was treated with Keflex at that time, urine culture confirmed E Coli and pan sensitive Today she is asymptomatic, her UTI resolved with antibiotics Denies fever, chills, dysuria ,hematuria nausea vomiting  Depression screen [facility] 2/9 05/27/2018 02/05/2018 07/17/2017  Decreased Interest 0 0 0  Down, Depressed, Hopeless 0 0 0  PHQ - 2 Score 0 0 0    Social History   Tobacco Use  . Smoking status: Never Smoker  . Smokeless tobacco: Never Used  Substance Use Topics  . Alcohol use: No    Alcohol/week: 0.0 oz  . Drug use: No    Review of Systems Per HPI unless specifically indicated above     Objective:    BP (!) 164/88 (BP Location: Left Arm, Cuff Size: Normal)   Pulse 72   Temp  97.7 F (36.5 C) (Oral)   Resp 16   Ht 5\' 6"  (1.676 m)   Wt 124 lb 9.6 oz (56.5 kg)   BMI 20.11 kg/m   Wt Readings from Last 3 Encounters:  05/27/18 124 lb 9.6 oz (56.5 kg)  05/14/18 125 lb (56.7 kg)  02/05/18 125 lb (56.7 kg)    Physical Exam  Constitutional: She is oriented to person, place, and time. She appears well-developed and well-nourished. No distress.  Well-appearing, comfortable, cooperative  HENT:  Head: Normocephalic and atraumatic.  Mouth/Throat: Oropharynx is clear and moist.  Eyes: Conjunctivae are normal. Right eye exhibits no discharge. Left eye exhibits no discharge.  Cardiovascular: Normal rate, regular rhythm, normal heart sounds and intact distal pulses.  No murmur heard. Pulmonary/Chest: Effort normal and breath sounds normal.  Musculoskeletal: She exhibits no edema.  Neurological: She is alert and oriented to person, place, and time.  Skin: Skin is warm and dry. No rash noted. She is not diaphoretic. No erythema.  Psychiatric: She has a normal mood and affect. Her behavior is normal.  Well groomed, good eye contact, normal speech and thoughts  Nursing note and vitals reviewed.  Results for orders placed or performed in visit on 05/14/18  Urine Culture  Result Value Ref Range   MICRO NUMBER: [id number]    SPECIMEN QUALITY: ADEQUATE    Sample Source NOT GIVEN    STATUS: FINAL  ISOLATE 1: Escherichia coli (A)       Susceptibility   Escherichia coli - URINE CULTURE, REFLEX    AMOX/CLAVULANIC <=2 Sensitive     AMPICILLIN <=2 Sensitive     AMPICILLIN/SULBACTAM <=2 Sensitive     CEFAZOLIN* <=4 Not Reportable      * For infections other than uncomplicated UTIcaused by E. coli, K. pneumoniae or P. mirabilis:Cefazolin is resistant if MIC > or = 8 mcg/mL.(Distinguishing susceptible versus intermediatefor isolates with MIC < or = 4 mcg/mL requiresadditional testing.)For uncomplicated UTI caused by E. coli,K. pneumoniae or P. mirabilis: Cefazolin issusceptible  if MIC <32 mcg/mL and predictssusceptible to the oral agents cefaclor, cefdinir,cefpodoxime, cefprozil, cefuroxime, cephalexinand loracarbef.    CEFEPIME <=1 Sensitive     CEFTRIAXONE <=1 Sensitive     CIPROFLOXACIN <=0.25 Sensitive     LEVOFLOXACIN <=0.12 Sensitive     ERTAPENEM <=0.5 Sensitive     GENTAMICIN <=1 Sensitive     IMIPENEM <=0.25 Sensitive     NITROFURANTOIN <=16 Sensitive     PIP/TAZO <=4 Sensitive     TOBRAMYCIN <=1 Sensitive     TRIMETH/SULFA* <=20 Sensitive      * For infections other than uncomplicated UTIcaused by E. coli, K. pneumoniae or P. mirabilis:Cefazolin is resistant if MIC > or = 8 mcg/mL.(Distinguishing susceptible versus intermediatefor isolates with MIC < or = 4 mcg/mL requiresadditional testing.)For uncomplicated UTI caused by E. coli,K. pneumoniae or P. mirabilis: Cefazolin issusceptible if MIC <32 mcg/mL and predictssusceptible to the oral agents cefaclor, cefdinir,cefpodoxime, cefprozil, cefuroxime, cephalexinand loracarbef.Legend:S = Susceptible  I = IntermediateR = Resistant  NS = Not susceptible* = Not tested  NR = Not reported**NN = See antimicrobic comments  POCT Urinalysis Dipstick  Result Value Ref Range   Color, UA yellow    Clarity, UA clear    Glucose, UA Negative Negative   Bilirubin, UA neg    Ketones, UA neg    Spec Grav, UA 1.010 1.010 - 1.025   Blood, UA trace    pH, UA 5.0 5.0 - 8.0   Protein, UA Negative Negative   Urobilinogen, UA 0.2 0.2 or 1.0 E.U./dL   Nitrite, UA negative    Leukocytes, UA Small (1+) (A) Negative   Appearance     Odor        Assessment & Plan:   Problem List Items Addressed This Visit    Hypertension - Primary (Chronic)    Still again acutely elevated BP in office, however confirmed on all 3 measures electronic cuff, her home BP cuff, and manual cuff, seems improved after re-check but still elevated. - Given home cuff has been calibrated, it seems accurate and therefore I trust her readings at home  120s/70s, seems to be most consistent with white coat HTN No other known complication. Normalized creatinine History of hypotension / syncope Failed Amlodipine (swelling). Discontinued Triamterene-HCTZ in past.  Plan:  1. INCREASE Lisinopril from 20mg  AM only to 20mg  BID - half of 40mg  tab twice daily now back to where she was before. Defer all other anti HTN meds will not add any new med in future 2. Encourage improved lifestyle - continue low sodium diet, regular exercise 3. Continue close monitor BP outside office weekly, bring readings 4. Follow-up 4 months for HTN - med adjust, review readings         #UTI - Resolved, s/p keflex E Coli UTI previously on culture  No orders of the defined types were placed in this encounter.   Follow  up plan: Return in about 4 months (around 09/27/2018) for HTN (home cuff calibrate), med adjust.  [name], DO [facility] 05/27/2018, 1:02 PM

## 2018-05-27 NOTE — Patient Instructions (Addendum)
Thank you for coming to the office today.  Keep up the good work  Keep checking BP at home, write down readings  Continue Lisinopril 40mg  tablet - HALF PILL IN AM and HALF PILL IN PM - eventually may switch to 1 pill a day instead.  If BP readings on average < 150/90 - no changes  If BP on average 3-5 times a week is >160/95 - then notify office - we may need to adjust treatment  Bring BP cuff next time for sure to double check  Please schedule a Follow-up Appointment to: Return in about 4 months (around 09/27/2018) for HTN (home cuff calibrate), med adjust.  If you have any other questions or concerns, please feel free to call the office or send a message through [facility]. You may also schedule an earlier appointment if necessary.  Additionally, you may be receiving a survey about your experience at our office within a few days to 1 week by e-mail or mail. We value your feedback.  [name], DO [place]

## 2018-05-27 NOTE — Assessment & Plan Note (Signed)
Still again acutely elevated BP in office, however confirmed on all 3 measures electronic cuff, her home BP cuff, and manual cuff, seems improved after re-check but still elevated. - Given home cuff has been calibrated, it seems accurate and therefore I trust her readings at home 120s/70s, seems to be most consistent with white coat HTN No other known complication. Normalized creatinine History of hypotension / syncope Failed Amlodipine (swelling). Discontinued Triamterene-HCTZ in past.  Plan:  1. INCREASE Lisinopril from 20mg  AM only to 20mg  BID - half of 40mg  tab twice daily now back to where she was before. Defer all other anti HTN meds will not add any new med in future 2. Encourage improved lifestyle - continue low sodium diet, regular exercise 3. Continue close monitor BP outside office weekly, bring readings 4. Follow-up 4 months for HTN - med adjust, review readings

## 2018-07-23 ENCOUNTER — Ambulatory Visit (INDEPENDENT_AMBULATORY_CARE_PROVIDER_SITE_OTHER): Payer: Medicare Other

## 2018-07-23 VITALS — BP 154/80 | HR 96 | Temp 97.8°F | Resp 17 | Ht 66.0 in | Wt 125.0 lb

## 2018-07-23 DIAGNOSIS — Z0000 Encounter for general adult medical examination without abnormal findings: Secondary | ICD-10-CM | POA: Diagnosis not present

## 2018-07-23 DIAGNOSIS — Z1231 Encounter for screening mammogram for malignant neoplasm of breast: Secondary | ICD-10-CM | POA: Diagnosis not present

## 2018-07-23 DIAGNOSIS — Z1239 Encounter for other screening for malignant neoplasm of breast: Secondary | ICD-10-CM

## 2018-07-23 NOTE — Patient Instructions (Addendum)
[name] , Thank you for taking time to come for your Medicare Wellness Visit. I appreciate your ongoing commitment to your health goals. Please review the following plan we discussed and let me know if I can assist you in the future.   Screening recommendations/referrals: Colonoscopy: completed 10/24/2017, no longer required Mammogram: completed 08/30/2017, no longer required- Please call [phone] to schedule your mammogram.  Bone Density: completed 05/04/2016, no longer required Recommended yearly ophthalmology/optometry visit for glaucoma screening and checkup Recommended yearly dental visit for hygiene and checkup  Vaccinations: Influenza vaccine: due 08/2018 Pneumococcal vaccine: completed series Tdap vaccine: up to date Shingles vaccine: shingrix eligible, check with your insurance company for coverage   Advanced directives: Please bring a copy of your health care power of attorney and living will to the office at your convenience.  Conditions/risks identified: recommend drinking at least 6-8 glasses of water a day   Next appointment: Follow up in one year for your medicare wellness visit.    Preventive Care 34 Years and Older, Female Preventive care refers to lifestyle choices and visits with your health care provider that can promote health and wellness. What does preventive care include?  A yearly physical exam. This is also called an annual well check.  Dental exams once or twice a year.  Routine eye exams. Ask your health care provider how often you should have your eyes checked.  Personal lifestyle choices, including:  Daily care of your teeth and gums.  Regular physical activity.  Eating a healthy diet.  Avoiding tobacco and drug use.  Limiting alcohol use.  Practicing safe sex.  Taking low-dose aspirin every day.  Taking vitamin and mineral supplements as recommended by your health care provider. What happens during an annual well check? The services  and screenings done by your health care provider during your annual well check will depend on your age, overall health, lifestyle risk factors, and family history of disease. Counseling  Your health care provider may ask you questions about your:  Alcohol use.  Tobacco use.  Drug use.  Emotional well-being.  Home and relationship well-being.  Sexual activity.  Eating habits.  History of falls.  Memory and ability to understand (cognition).  Work and work Statistician.  Reproductive health. Screening  You may have the following tests or measurements:  Height, weight, and BMI.  Blood pressure.  Lipid and cholesterol levels. These may be checked every 5 years, or more frequently if you are over 69 years old.  Skin check.  Lung cancer screening. You may have this screening every year starting at age 25 if you have a 30-pack-year history of smoking and currently smoke or have quit within the past 15 years.  Fecal occult blood test (FOBT) of the stool. You may have this test every year starting at age 17.  Flexible sigmoidoscopy or colonoscopy. You may have a sigmoidoscopy every 5 years or a colonoscopy every 10 years starting at age 26.  Hepatitis C blood test.  Hepatitis B blood test.  Sexually transmitted disease (STD) testing.  Diabetes screening. This is done by checking your blood sugar (glucose) after you have not eaten for a while (fasting). You may have this done every 1-3 years.  Bone density scan. This is done to screen for osteoporosis. You may have this done starting at age 68.  Mammogram. This may be done every 1-2 years. Talk to your health care provider about how often you should have regular mammograms. Talk with your health care  provider about your test results, treatment options, and if necessary, the need for more tests. Vaccines  Your health care provider may recommend certain vaccines, such as:  Influenza vaccine. This is recommended every  year.  Tetanus, diphtheria, and acellular pertussis (Tdap, Td) vaccine. You may need a Td booster every 10 years.  Zoster vaccine. You may need this after age 77.  Pneumococcal 13-valent conjugate (PCV13) vaccine. One dose is recommended after age 71.  Pneumococcal polysaccharide (PPSV23) vaccine. One dose is recommended after age 68. Talk to your health care provider about which screenings and vaccines you need and how often you need them. This information is not intended to replace advice given to you by your health care provider. Make sure you discuss any questions you have with your health care provider. Document Released: 12/10/2015 Document Revised: 08/02/2016 Document Reviewed: 09/14/2015 Elsevier Interactive Patient Education  2017 [place] Prevention in the Home Falls can cause injuries. They can happen to people of all ages. There are many things you can do to make your home safe and to help prevent falls. What can I do on the outside of my home?  Regularly fix the edges of walkways and driveways and fix any cracks.  Remove anything that might make you trip as you walk through a door, such as a raised step or threshold.  Trim any bushes or trees on the path to your home.  Use bright outdoor lighting.  Clear any walking paths of anything that might make someone trip, such as rocks or tools.  Regularly check to see if handrails are loose or broken. Make sure that both sides of any steps have handrails.  Any raised decks and porches should have guardrails on the edges.  Have any leaves, snow, or ice cleared regularly.  Use sand or salt on walking paths during winter.  Clean up any spills in your garage right away. This includes oil or grease spills. What can I do in the bathroom?  Use night lights.  Install grab bars by the toilet and in the tub and shower. Do not use towel bars as grab bars.  Use non-skid mats or decals in the tub or shower.  If you  need to sit down in the shower, use a plastic, non-slip stool.  Keep the floor dry. Clean up any water that spills on the floor as soon as it happens.  Remove soap buildup in the tub or shower regularly.  Attach bath mats securely with double-sided non-slip rug tape.  Do not have throw rugs and other things on the floor that can make you trip. What can I do in the bedroom?  Use night lights.  Make sure that you have a light by your bed that is easy to reach.  Do not use any sheets or blankets that are too big for your bed. They should not hang down onto the floor.  Have a firm chair that has side arms. You can use this for support while you get dressed.  Do not have throw rugs and other things on the floor that can make you trip. What can I do in the kitchen?  Clean up any spills right away.  Avoid walking on wet floors.  Keep items that you use a lot in easy-to-reach places.  If you need to reach something above you, use a strong step stool that has a grab bar.  Keep electrical cords out of the way.  Do not use floor polish  or wax that makes floors slippery. If you must use wax, use non-skid floor wax.  Do not have throw rugs and other things on the floor that can make you trip. What can I do with my stairs?  Do not leave any items on the stairs.  Make sure that there are handrails on both sides of the stairs and use them. Fix handrails that are broken or loose. Make sure that handrails are as long as the stairways.  Check any carpeting to make sure that it is firmly attached to the stairs. Fix any carpet that is loose or worn.  Avoid having throw rugs at the top or bottom of the stairs. If you do have throw rugs, attach them to the floor with carpet tape.  Make sure that you have a light switch at the top of the stairs and the bottom of the stairs. If you do not have them, ask someone to add them for you. What else can I do to help prevent falls?  Wear shoes  that:  Do not have high heels.  Have rubber bottoms.  Are comfortable and fit you well.  Are closed at the toe. Do not wear sandals.  If you use a stepladder:  Make sure that it is fully opened. Do not climb a closed stepladder.  Make sure that both sides of the stepladder are locked into place.  Ask someone to hold it for you, if possible.  Clearly mark and make sure that you can see:  Any grab bars or handrails.  First and last steps.  Where the edge of each step is.  Use tools that help you move around (mobility aids) if they are needed. These include:  Canes.  Walkers.  Scooters.  Crutches.  Turn on the lights when you go into a dark area. Replace any light bulbs as soon as they burn out.  Set up your furniture so you have a clear path. Avoid moving your furniture around.  If any of your floors are uneven, fix them.  If there are any pets around you, be aware of where they are.  Review your medicines with your doctor. Some medicines can make you feel dizzy. This can increase your chance of falling. Ask your doctor what other things that you can do to help prevent falls. This information is not intended to replace advice given to you by your health care provider. Make sure you discuss any questions you have with your health care provider. Document Released: 09/09/2009 Document Revised: 04/20/2016 Document Reviewed: 12/18/2014 Elsevier Interactive Patient Education  2017 [employer].

## 2018-07-23 NOTE — Progress Notes (Signed)
Subjective:   Brittany Oneal is a 84 y.o. female who presents for Medicare Annual (Subsequent) preventive examination.  Review of Systems:   Cardiac Risk Factors include: hypertension;advanced age (>31men, >77 women);dyslipidemia     Objective:     Vitals: BP (!) 154/80 (BP Location: Left Arm, Cuff Size: Normal)   Pulse 96   Temp 97.8 F (36.6 C) (Oral)   Resp 17   Ht 5\' 6"  (1.676 m)   Wt 125 lb (56.7 kg)   BMI 20.18 kg/m   Body mass index is 20.18 kg/m.  Advanced Directives 07/23/2018 10/24/2017 07/17/2017  Does Patient Have a Medical Advance Directive? Yes Yes Yes  Type of Advance Directive Living will;Healthcare Power of Attorney Living will -  Copy of [place] in Chart? No - copy requested - -    Tobacco Social History   Tobacco Use  Smoking Status Never Smoker  Smokeless Tobacco Never Used     Counseling given: Not Answered   Clinical Intake:  Pre-visit preparation completed: Yes  Pain : No/denies pain     Nutritional Status: BMI of 19-24  Normal Nutritional Risks: None Diabetes: No  How often do you need to have someone help you when you read instructions, pamphlets, or other written materials from your doctor or pharmacy?: 1 - Never What is the last grade level you completed in school?: 12th grade  Interpreter Needed?: No  Information entered by :: Tiffany Hill,LPN   Past Medical History:  Diagnosis Date  . Arthritis   . Family history of malignant neoplasm of breast   . Family history of malignant neoplasm of gastrointestinal tract   . History of colon cancer   . Personal history of malignant neoplasm of large intestine   . Special screening for malignant neoplasms, colon   . Thyroid disease    Past Surgical History:  Procedure Laterality Date  . ABDOMINAL HYSTERECTOMY  1983  . COLON SURGERY  1990   resection d/t cancer  . COLONOSCOPY  1997,2001,2004,2006, 2011  . COLONOSCOPY WITH PROPOFOL N/A 10/24/2017   Procedure: COLONOSCOPY WITH PROPOFOL;  Surgeon: Christene Lye, MD;  Location: [facility] ENDOSCOPY;  Service: Endoscopy;  Laterality: N/A;  . EYE SURGERY  Sept 2013  . 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 SALPINGOOPHORECTOMY     Family History  Problem Relation Age of Onset  . Hypertension Mother   . Hypertension Father   . Diabetes Father   . Cancer Other        FH of breast and colon cancer  . Cancer Brother        colon  . Cancer Sister        breast   Social History   Socioeconomic History  . Marital status: Married    Spouse name: Not on file  . Number of children: Not on file  . Years of education: Not on file  . Highest education level: High school graduate  Occupational History  . Not on file  Social Needs  . Financial resource strain: Not hard at all  . Food insecurity:    Worry: Never true    Inability: Never true  . Transportation needs:    Medical: No    Non-medical: No  Tobacco Use  . Smoking status: Never Smoker  . Smokeless tobacco: Never Used  Substance and Sexual Activity  . Alcohol use: No    Alcohol/week: 0.0 standard drinks  . Drug use:  No  . Sexual activity: Not on file  Lifestyle  . Physical activity:    Days per week: 1 day    Minutes per session: 120 min  . Stress: Not at all  Relationships  . Social connections:    Talks on phone: More than three times a week    Gets together: More than three times a week    Attends religious service: More than 4 times per year    Active member of club or organization: No    Attends meetings of clubs or organizations: Never    Relationship status: Married  Other Topics Concern  . Not on file  Social History Narrative   Gym one to 2 days  Week     Outpatient Encounter Medications as of 07/23/2018  Medication Sig  . aspirin EC 81 MG tablet Take 1 tablet (81 mg total) by mouth daily.  . calcium carbonate (OS-CAL - DOSED IN MG OF ELEMENTAL CALCIUM) 1250 MG tablet Take 1 tablet  by mouth.  . Glucosamine-Chondroitin (GLUCOSAMINE CHONDR COMPLEX) 500-400 MG CAPS Take 2 capsules by mouth daily.  Marland Kitchen levothyroxine (SYNTHROID, LEVOTHROID) 50 MCG tablet TAKE 1 TABLET DAILY  . lisinopril (PRINIVIL,ZESTRIL) 40 MG tablet TAKE ONE-HALF (1/2) TABLET TWICE A DAY  . Omega-3 Fatty Acids (FISH OIL) 1000 MG CAPS Take 1 capsule by mouth daily.   . polyethylene glycol powder (GLYCOLAX/MIRALAX) powder 255 grams one bottle for colonoscopy prep (Patient not taking: Reported on 07/23/2018)   No facility-administered encounter medications on file as of 07/23/2018.     Activities of Daily Living In your present state of health, do you have any difficulty performing the following activities: 07/23/2018  Hearing? N  Vision? N  Difficulty concentrating or making decisions? N  Walking or climbing stairs? N  Dressing or bathing? N  Doing errands, shopping? N  Preparing Food and eating ? N  Using the Toilet? N  In the past six months, have you accidently leaked urine? N  Do you have problems with loss of bowel control? N  Managing your Medications? N  Managing your Finances? N  Housekeeping or managing your Housekeeping? N  Some recent data might be hidden    Patient Care Team: Olin Hauser, DO as PCP - General (Family Medicine) Christene Lye, MD (General Surgery) Dasher, Rayvon Char, MD (Dermatology)    Assessment:   This is a routine wellness examination for Brittany Oneal.  Exercise Activities and Dietary recommendations Current Exercise Habits: Structured exercise class, Type of exercise: strength training/weights;treadmill, Time (Minutes): > 60, Frequency (Times/Week): 1, Weekly Exercise (Minutes/Week): 0, Intensity: Mild, Exercise limited by: None identified  Goals    . Exercise 3x per week (30 min per time)     Patient and her spouse exercise twice a weekly. She says it would be a goal to be able to increase number of days. Her overall goal is to live a healthy life.      . Increase water intake     Recommend drinking at least 4-5 glasses of water a day        Fall Risk Fall Risk  07/23/2018 05/27/2018 02/05/2018 07/17/2017 11/28/2016  Falls in the past year? No No No No No  Number falls in past yr: - - - - -  Injury with Fall? - - - - -  Follow up - - - - -   Is the patient's home free of loose throw rugs in walkways, pet beds, electrical cords, etc?  yes      Grab bars in the bathroom? yes      Handrails on the stairs?   No stairs       Adequate lighting?   yes  Timed Get Up and Go performed: Completed in 8 seconds with no use of assistive devices, steady gait. No intervention needed at this time.   Depression Screen PHQ 2/9 Scores 07/23/2018 05/27/2018 02/05/2018 07/17/2017  PHQ - 2 Score 0 0 0 0     Cognitive Function MMSE - Mini Mental State Exam 08/22/2016  Orientation to time 5  Orientation to Place 5  Registration 3  Attention/ Calculation 5  Recall 3  Language- name 2 objects 2  Language- repeat 1  Language- follow 3 step command 3  Language- read & follow direction 1  Write a sentence 1  Copy design 0  Total score 29     6CIT Screen 07/23/2018 07/17/2017  What Year? 0 points 0 points  What month? 0 points 0 points  What time? 0 points 0 points  Count back from 20 0 points 0 points  Months in reverse 0 points 0 points  Repeat phrase 2 points 2 points  Total Score 2 2    Immunization History  Administered Date(s) Administered  . Influenza, High Dose Seasonal PF 08/03/2015, 08/22/2016, 07/31/2017  . Influenza, Seasonal, Injecte, Preservative Fre 08/20/2013  . Influenza-Unspecified 08/03/2015  . Pneumococcal Conjugate-13 09/29/2014, 10/07/2015  . Pneumococcal Polysaccharide-23 11/27/1994  . Tdap 01/21/2014    Qualifies for Shingles Vaccine? Yes, discussed shingrix vaccine  Screening Tests Health Maintenance  Topic Date Due  . INFLUENZA VACCINE  06/27/2018  . TETANUS/TDAP  01/22/2024  . DEXA SCAN  Completed  . PNA vac Low  Risk Adult  Completed    Cancer Screenings: Lung: Low Dose CT Chest recommended if Age 55-80 years, 30 pack-year currently smoking OR have quit w/in 15years. Patient does not qualify. Breast:  Up to date on Mammogram? Yes   08/30/2017 Up to date of Bone Density/Dexa? Yes 05/04/2016 Colorectal: completed 10/24/2017  Additional Screenings:  Hepatitis C Screening: not indicated     Plan:    I have personally reviewed and addressed the Medicare Annual Wellness questionnaire and have noted the following in the patient's chart:  A. Medical and social history B. Use of alcohol, tobacco or illicit drugs  C. Current medications and supplements D. Functional ability and status E.  Nutritional status F.  Physical activity G. Advance directives H. List of other physicians I.  Hospitalizations, surgeries, and ER visits in previous 12 months J.  [place] such as hearing and vision if needed, cognitive and depression L. Referrals and appointments   In addition, I have reviewed and discussed with patient certain preventive protocols, quality metrics, and best practice recommendations. A written personalized care plan for preventive services as well as general preventive health recommendations were provided to patient.   Signed,  Tyler Aas, LPN Nurse Health Advisor   Nurse Notes: Patient brought BP cuff from home to make sure it was getting accurate readings. Checked patients BP cuff with reading of 192/102, checked manually with office BP cuff with reading of 192/100. Rechecked BP after 5 minutes with office BP cuff, reading was 170/94. Per Dr.Karamalegos no intervention at this time. Rechecked prior to patient leaving office with reading of 154/80. Patient will check her BP when she gets home and settled down and call with any problems.   Ordered screening mammogram for October, patient states  Dr.Sankar has retired and she wanted to know who she needed to see this year after her  mammogram or if she just needs to follow up at PCP office from now on.

## 2018-09-04 ENCOUNTER — Ambulatory Visit (INDEPENDENT_AMBULATORY_CARE_PROVIDER_SITE_OTHER): Payer: Medicare Other

## 2018-09-04 DIAGNOSIS — Z23 Encounter for immunization: Secondary | ICD-10-CM | POA: Diagnosis not present

## 2018-10-01 ENCOUNTER — Ambulatory Visit: Payer: Medicare Other | Admitting: Family Medicine

## 2018-10-07 ENCOUNTER — Other Ambulatory Visit: Payer: Self-pay | Admitting: Family Medicine

## 2018-10-07 DIAGNOSIS — J019 Acute sinusitis, unspecified: Secondary | ICD-10-CM

## 2018-10-07 MED ORDER — IPRATROPIUM BROMIDE 0.06 % NA SOLN: 2.0000 | Freq: Four times a day (QID) | NASAL | 0 refills | Status: DC

## 2018-10-18 ENCOUNTER — Encounter: Payer: Self-pay | Admitting: Family Medicine

## 2018-10-18 ENCOUNTER — Ambulatory Visit: Payer: Medicare Other | Admitting: Family Medicine

## 2018-10-18 VITALS — BP 160/84 | HR 73 | Temp 97.9°F | Resp 16 | Ht 66.0 in | Wt 123.0 lb

## 2018-10-18 DIAGNOSIS — I10 Essential (primary) hypertension: Secondary | ICD-10-CM | POA: Diagnosis not present

## 2018-10-18 NOTE — Patient Instructions (Addendum)
Thank you for coming to the office today.  Keep track of BP  I am still hesitant to increase Lisinopril dose further at this point. Caution with low BP readings at home at times in afternoon.  Our reading still elevated here. I think it is possible to have stress elevating BP with white coat hypertension  No change to meds today  DUE for FASTING BLOOD WORK (no food or drink after midnight before the lab appointment, only water or coffee without cream/sugar on the morning of)  SCHEDULE "Lab Only" visit in the morning at the clinic for lab draw in 6 MONTHS   - Make sure Lab Only appointment is at about 1 week before your next appointment, so that results will be available  For Lab Results, once available within 2-3 days of blood draw, you can can log in to MyChart online to view your results and a brief explanation. Also, we can discuss results at next follow-up visit.   Please schedule a Follow-up Appointment to: Return in about 6 months (around 04/18/2019) for Annual Physical.  If you have any other questions or concerns, please feel free to call the office or send a message through [place]. You may also schedule an earlier appointment if necessary.  Additionally, you may be receiving a survey about your experience at our office within a few days to 1 week by e-mail or mail. We value your feedback.  [name], DO [place]

## 2018-10-18 NOTE — Progress Notes (Signed)
Subjective:    Patient ID: Brittany Oneal, female    DOB: Jul 09, 1931, 84 y.o.   MRN: [id number]  Brittany Oneal is a 84 y.o. female presenting on 10/18/2018 for Hypertension   HPI   CHRONIC HTN: - Last visit with me 05/2018, for same problem HTN, treated with continued Lisinopril 20mg  BID, see prior notes for background information. - Interval update with concerns for some white coat hypertension given her recent BP cuff calibration, she checks home readings calibrated home BP cuff recently at last [facility] in 06/2018, now her home readings are normal over past 2 months avg 120-130/60-70s, occasional lower 84/61 and 109/63 - However higher readings in office - Today patient reports she is asymptomatic. Feels good. Except does recognize when feels lower BP in afternoons at times, infrequent but does happen. - She admits white coat hypertension and feeling anxious before BP check in office - Failed prior meds Amlodipine, Maxzide (Triamterene HCTZ in past) Denies CP, dyspnea, HA, edema, dizziness / lightheadedness   Additionally admits recent URI sinus symptoms, improving. Took some OTC medicines for cold, none today.  Health Maintenance: UTD Flu Vaccine 09/04/18  Depression screen [facility] 2/9 10/18/2018 07/23/2018 05/27/2018  Decreased Interest 0 0 0  Down, Depressed, Hopeless 0 0 0  PHQ - 2 Score 0 0 0    Social History   Tobacco Use  . Smoking status: Never Smoker  . Smokeless tobacco: Never Used  Substance Use Topics  . Alcohol use: No    Alcohol/week: 0.0 standard drinks  . Drug use: No    Review of Systems Per HPI unless specifically indicated above     Objective:    BP (!) 160/84 (BP Location: Left Arm, Cuff Size: Normal)   Pulse 73   Temp 97.9 F (36.6 C) (Oral)   Resp 16   Ht 5\' 6"  (1.676 m)   Wt 123 lb (55.8 kg)   BMI 19.85 kg/m   Wt Readings from Last 3 Encounters:  10/18/18 123 lb (55.8 kg)  07/23/18 125 lb (56.7 kg)  05/27/18 124 lb 9.6 oz (56.5 kg)    Physical  Exam  Constitutional: She is oriented to person, place, and time. She appears well-developed and well-nourished. No distress.  Well-appearing, comfortable, cooperative  HENT:  Head: Normocephalic and atraumatic.  Mouth/Throat: Oropharynx is clear and moist.  Eyes: Conjunctivae are normal. Right eye exhibits no discharge. Left eye exhibits no discharge.  Cardiovascular: Normal rate, regular rhythm, normal heart sounds and intact distal pulses.  No murmur heard. Pulmonary/Chest: Effort normal and breath sounds normal.  Musculoskeletal: She exhibits no edema.  Neurological: She is alert and oriented to person, place, and time.  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 No rash noted. She is not diaphoretic. No erythema.  Psychiatric: She has a normal mood and affect. Her behavior is normal.  Well groomed, good eye contact, normal speech and thoughts. Not anxious  Nursing note and vitals reviewed.     Assessment & Plan:   Problem List Items Addressed This Visit    Essential hypertension - Primary    Still again acutely elevated BP in office, improved after re-check but still elevated. Multiple occasions her home blood pressure cuff has been calibrated Home readings available today 120-130s/70s Seems to be most consistent with white coat HTN No other known complication. History of hypotension / syncope Failed Amlodipine (swelling). Discontinued Triamterene-HCTZ in  past.  Plan:  Hesitant to inc BP medication due to history of low BP  1. Continue current med Lisinopril 20mg  BID 2. Encourage improved lifestyle - continue low sodium diet, regular exercise 3. Continue close monitor BP outside office weekly, bring readings 4. Follow-up 6 months for annual - may return sooner if elevated BP      White coat syndrome with diagnosis of hypertension      No orders of the defined types were placed in this  encounter.   Follow up plan: Return in about 6 months (around 04/18/2019) for Annual Physical.  Future labs ordered for 04/14/19  Brittany Oneal, [facility] 10/18/2018, 11:05 AM

## 2018-10-19 ENCOUNTER — Other Ambulatory Visit: Payer: Self-pay | Admitting: Family Medicine

## 2018-10-19 DIAGNOSIS — E039 Hypothyroidism, unspecified: Secondary | ICD-10-CM

## 2018-10-19 DIAGNOSIS — E7800 Pure hypercholesterolemia, unspecified: Secondary | ICD-10-CM

## 2018-10-19 DIAGNOSIS — I10 Essential (primary) hypertension: Secondary | ICD-10-CM | POA: Insufficient documentation

## 2018-10-19 DIAGNOSIS — Z0000 Encounter for general adult medical examination without abnormal findings: Secondary | ICD-10-CM

## 2018-10-19 DIAGNOSIS — R7303 Prediabetes: Secondary | ICD-10-CM

## 2018-10-19 NOTE — Assessment & Plan Note (Addendum)
Still again acutely elevated BP in office, improved after re-check but still elevated. Multiple occasions her home blood pressure cuff has been calibrated Home readings available today 120-130s/70s Seems to be most consistent with white coat HTN No other known complication. History of hypotension / syncope Failed Amlodipine (swelling). Discontinued Triamterene-HCTZ in past.  Plan:  Hesitant to inc BP medication due to history of low BP  1. Continue current med Lisinopril 20mg  BID 2. Encourage improved lifestyle - continue low sodium diet, regular exercise 3. Continue close monitor BP outside office weekly, bring readings 4. Follow-up 6 months for annual - may return sooner if elevated BP

## 2019-03-26 ENCOUNTER — Other Ambulatory Visit: Payer: Self-pay | Admitting: Family Medicine

## 2019-03-26 DIAGNOSIS — I10 Essential (primary) hypertension: Secondary | ICD-10-CM

## 2019-03-26 DIAGNOSIS — E039 Hypothyroidism, unspecified: Secondary | ICD-10-CM

## 2019-04-14 ENCOUNTER — Other Ambulatory Visit: Payer: Self-pay

## 2019-04-14 ENCOUNTER — Other Ambulatory Visit: Payer: Medicare Other

## 2019-04-14 DIAGNOSIS — Z0000 Encounter for general adult medical examination without abnormal findings: Secondary | ICD-10-CM

## 2019-04-14 DIAGNOSIS — E039 Hypothyroidism, unspecified: Secondary | ICD-10-CM

## 2019-04-14 DIAGNOSIS — R7303 Prediabetes: Secondary | ICD-10-CM

## 2019-04-14 DIAGNOSIS — I10 Essential (primary) hypertension: Secondary | ICD-10-CM

## 2019-04-14 DIAGNOSIS — E7800 Pure hypercholesterolemia, unspecified: Secondary | ICD-10-CM

## 2019-04-15 LAB — COMPLETE METABOLIC PANEL WITH GFR
AG Ratio: 1.8 (calc) (ref 1.0–2.5)
ALT: 10 U/L (ref 6–29)
AST: 18 U/L (ref 10–35)
Albumin: 4.2 g/dL (ref 3.6–5.1)
Alkaline phosphatase (APISO): 60 U/L (ref 37–153)
BUN: 19 mg/dL (ref 7–25)
CO2: 27 mmol/L (ref 20–32)
Calcium: 9.4 mg/dL (ref 8.6–10.4)
Chloride: 99 mmol/L (ref 98–110)
Creat: 0.81 mg/dL (ref 0.60–0.88)
GFR, Est African American: 77 mL/min/{1.73_m2} (ref >=60)
GFR, Est Non African American: 66 mL/min/{1.73_m2} (ref >=60)
Globulin: 2.4 g/dL (calc) (ref 1.9–3.7)
Glucose, Bld: 81 mg/dL (ref 65–99)
Potassium: 4.1 mmol/L (ref 3.5–5.3)
Sodium: 135 mmol/L (ref 135–146)
Total Bilirubin: 0.7 mg/dL (ref 0.2–1.2)
Total Protein: 6.6 g/dL (ref 6.1–8.1)

## 2019-04-15 LAB — CBC WITH DIFFERENTIAL/PLATELET
Absolute Monocytes: 499 cells/uL (ref 200–950)
Basophils Absolute: 52 cells/uL (ref 0–200)
Basophils Relative: 1.2 %
Eosinophils Absolute: 189 cells/uL (ref 15–500)
Eosinophils Relative: 4.4 %
HCT: 40.2 % (ref 35.0–45.0)
Hemoglobin: 13.8 g/dL (ref 11.7–15.5)
Lymphs Abs: 1694 cells/uL (ref 850–3900)
MCH: 29.6 pg (ref 27.0–33.0)
MCHC: 34.3 g/dL (ref 32.0–36.0)
MCV: 86.3 fL (ref 80.0–100.0)
MPV: 10.5 fL (ref 7.5–12.5)
Monocytes Relative: 11.6 %
Neutro Abs: 1866 cells/uL (ref 1500–7800)
Neutrophils Relative %: 43.4 %
Platelets: 174 10*3/uL (ref 140–400)
RBC: 4.66 10*6/uL (ref 3.80–5.10)
RDW: 13.2 % (ref 11.0–15.0)
Total Lymphocyte: 39.4 %
WBC: 4.3 10*3/uL (ref 3.8–10.8)

## 2019-04-15 LAB — LIPID PANEL
Cholesterol: 175 mg/dL (ref <200)
HDL: 72 mg/dL (ref >=50)
LDL Cholesterol (Calc): 89 mg/dL (calc)
Non-HDL Cholesterol (Calc): 103 mg/dL (calc) (ref <130)
Total CHOL/HDL Ratio: 2.4 (calc) (ref <5.0)
Triglycerides: 62 mg/dL (ref <150)

## 2019-04-15 LAB — TSH: TSH: 2.75 mIU/L (ref 0.40–4.50)

## 2019-04-15 LAB — HEMOGLOBIN A1C
Hgb A1c MFr Bld: 5.4 % of total Hgb (ref <5.7)
Mean Plasma Glucose: 108 (calc)
eAG (mmol/L): 6 (calc)

## 2019-04-18 ENCOUNTER — Other Ambulatory Visit: Payer: Self-pay

## 2019-04-18 ENCOUNTER — Other Ambulatory Visit: Payer: Self-pay | Admitting: Family Medicine

## 2019-04-18 ENCOUNTER — Ambulatory Visit (INDEPENDENT_AMBULATORY_CARE_PROVIDER_SITE_OTHER): Payer: Medicare Other | Admitting: Family Medicine

## 2019-04-18 ENCOUNTER — Encounter: Payer: Self-pay | Admitting: Family Medicine

## 2019-04-18 VITALS — BP 145/78 | HR 69 | Ht 66.0 in | Wt 123.4 lb

## 2019-04-18 DIAGNOSIS — E039 Hypothyroidism, unspecified: Secondary | ICD-10-CM

## 2019-04-18 DIAGNOSIS — I10 Essential (primary) hypertension: Secondary | ICD-10-CM

## 2019-04-18 DIAGNOSIS — E7800 Pure hypercholesterolemia, unspecified: Secondary | ICD-10-CM

## 2019-04-18 DIAGNOSIS — Z0000 Encounter for general adult medical examination without abnormal findings: Secondary | ICD-10-CM | POA: Diagnosis not present

## 2019-04-18 DIAGNOSIS — R7309 Other abnormal glucose: Secondary | ICD-10-CM

## 2019-04-18 DIAGNOSIS — M069 Rheumatoid arthritis, unspecified: Secondary | ICD-10-CM

## 2019-04-18 NOTE — Progress Notes (Signed)
Subjective:    Patient ID: Brittany Oneal, female    DOB: 1931-02-06, 85 y.o.   MRN: [id number]  Brittany Oneal is a 85 y.o. female presenting on 04/18/2019 for Annual Exam   HPI   Here for Annual Physical and Lab Review  CHRONIC HTN: - Last visit with me 09/2018, for HTN, treated withLisinopril 20mg  BID see prior notes for background information. - Today patient reportsthat her home readings have been fairly well controlled, still monitoring regularly usually avg 120-130 and occasional 140. Here at office usually higher BP with "white coat hypertension" Current Meds - Lisinopril 20mg  BID (half of 40mg ) Reports good compliance, took meds today. Tolerating well, w/o complaints - Taking ASA 81mg  daily tolerating well  Elevated Serum Creatinine - RESOLVED Normalized Creatinine on last lab.  Hypothyroidism, chronic Last lab normal TSH Stable chronic problem, still taking Levothyroxine 74mcg daily She denies any symptoms of low thyroid hormone at this time  Elevated A1c Prior A1c 5.6 in past, but has been normal range since. Meds:Never on medication for blood sugar Currently on ACEi Lifestyle: - Weight is stable - Diet (Still focusing on healthy diet, avoiding sweets, improving appetite, drinks mostly water, some tea, coffee)  - Exercise (Regular exercises mostly outdoor and at home) - Family history of DM2 (Father) Denies hypoglycemia  Mild Elevated LDL - Reports no concerns. Last lipid panel 03/2019 normal - Currently takingFish Oil omega 3 1g daily - No known history ofCAD, MI, CVA - She has been taking ASA 81mg  daily since last visit, doing well without bleeding or other episodes - She has never been on Statin medicine, has declined this in the past  Chronic L Facial palsy / Drooling - reports this problem bothers her frequently, has not tried anything for it. 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  PMH - Rheumatoid arthritis, taking  meloxicam, PRN joint swelling and pain.  Health Maintenance: UTD DEXA, 04/2016 - T score normal, without history of osteopenia / osteoporosis UTD PNA vaccine series, completed UTD TDap 2015    Depression screen [facility] 2/9 04/18/2019 10/18/2018 07/23/2018  Decreased Interest 0 0 0  Down, Depressed, Hopeless 0 0 0  PHQ - 2 Score 0 0 0    Past Medical History:  Diagnosis Date  . Arthritis   . Family history of malignant neoplasm of breast   . Family history of malignant neoplasm of gastrointestinal tract   . History of colon cancer   . Personal history of malignant neoplasm of large intestine   . Special screening for malignant neoplasms, colon   . Thyroid disease    Past Surgical History:  Procedure Laterality Date  . ABDOMINAL HYSTERECTOMY  1983  . COLON SURGERY  1990   resection d/t cancer  . COLONOSCOPY  1997,2001,2004,2006, 2011  . COLONOSCOPY WITH PROPOFOL N/A 10/24/2017   Procedure: COLONOSCOPY WITH PROPOFOL;  Surgeon: Christene Lye, MD;  Location: [facility] ENDOSCOPY;  Service: Endoscopy;  Laterality: N/A;  . EYE SURGERY  Sept 2013  . 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 SALPINGOOPHORECTOMY     Social History   Socioeconomic History  . Marital status: Married    Spouse name: Not on file  . Number of children: Not on file  . Years of education: Not on file  . Highest education level: High school graduate  Occupational History  . Not on file  Social Needs  . Financial resource strain:  Not hard at all  . Food insecurity:    Worry: Never true    Inability: Never true  . Transportation needs:    Medical: No    Non-medical: No  Tobacco Use  . Smoking status: Never Smoker  . Smokeless tobacco: Never Used  Substance and Sexual Activity  . Alcohol use: No    Alcohol/week: 0.0 standard drinks  . Drug use: No  . Sexual activity: Not on file  Lifestyle  . Physical activity:    Days per week: 1 day    Minutes per session: 120 min  . Stress:  Not at all  Relationships  . Social connections:    Talks on phone: More than three times a week    Gets together: More than three times a week    Attends religious service: More than 4 times per year    Active member of club or organization: No    Attends meetings of clubs or organizations: Never    Relationship status: Married  . Intimate partner violence:    Fear of current or ex partner: No    Emotionally abused: No    Physically abused: No    Forced sexual activity: No  Other Topics Concern  . Not on file  Social History Narrative   Gym one to 2 days  Week    Family History  Problem Relation Age of Onset  . Hypertension Mother   . Hypertension Father   . Diabetes Father   . Cancer Other        FH of breast and colon cancer  . Cancer Brother        colon  . Cancer Sister        breast   Current Outpatient Medications on File Prior to Visit  Medication Sig  . aspirin EC 81 MG tablet Take 1 tablet (81 mg total) by mouth daily.  . calcium carbonate (OS-CAL - DOSED IN MG OF ELEMENTAL CALCIUM) 1250 MG tablet Take 1 tablet by mouth.  . Glucosamine-Chondroitin (GLUCOSAMINE CHONDR COMPLEX) 500-400 MG CAPS Take 2 capsules by mouth daily.  Marland Kitchen levothyroxine (SYNTHROID) 50 MCG tablet TAKE 1 TABLET DAILY  . lisinopril (ZESTRIL) 40 MG tablet TAKE ONE-HALF (1/2) TABLET TWICE A DAY  . Omega-3 Fatty Acids (FISH OIL) 1000 MG CAPS Take 1 capsule by mouth daily.    No current facility-administered medications on file prior to visit.     Review of Systems  Constitutional: Negative for activity change, appetite change, chills, diaphoresis, fatigue and fever.  HENT: Negative for congestion and hearing loss.   Eyes: Negative for visual disturbance.  Respiratory: Negative for apnea, cough, chest tightness, shortness of breath and wheezing.   Cardiovascular: Negative for chest pain, palpitations and leg swelling.  Gastrointestinal: Negative for abdominal pain, anal bleeding, blood in stool,  constipation, diarrhea, nausea and vomiting.  Endocrine: Negative for cold intolerance.  Genitourinary: Negative for dysuria, frequency and hematuria.  Musculoskeletal: Negative for arthralgias, back pain and neck pain.  Skin: Negative for rash.  Allergic/Immunologic: Negative for environmental allergies.  Neurological: Negative for dizziness, weakness, light-headedness, numbness and headaches.  Hematological: Negative for adenopathy.  Psychiatric/Behavioral: Negative for behavioral problems, dysphoric mood and sleep disturbance. The patient is not nervous/anxious.    Per HPI unless specifically indicated above      Objective:    BP (!) 145/78 (BP Location: Left Arm, Cuff Size: Normal)   Pulse 69   Ht 5\' 6"  (1.676 m)   Wt 123 lb  6.4 oz (56 kg)   BMI 19.92 kg/m   Wt Readings from Last 3 Encounters:  04/18/19 123 lb 6.4 oz (56 kg)  10/18/18 123 lb (55.8 kg)  07/23/18 125 lb (56.7 kg)    Physical Exam Vitals signs and nursing note reviewed.  Constitutional:      General: She is not in acute distress.    Appearance: She is well-developed. She is not diaphoretic.     Comments: Well-appearing thin 85 yr old female, comfortable, cooperative  HENT:     Head: Normocephalic and atraumatic.  Eyes:     General:        Right eye: No discharge.        Left eye: No discharge.     Conjunctiva/sclera: Conjunctivae normal.     Pupils: Pupils are equal, round, and reactive to light.  Neck:     Musculoskeletal: Normal range of motion and neck supple.     Thyroid: No thyromegaly.  Cardiovascular:     Rate and Rhythm: Normal rate and regular rhythm.     Heart sounds: Normal heart sounds. No murmur.  Pulmonary:     Effort: Pulmonary effort is normal. No respiratory distress.     Breath sounds: Normal breath sounds. No wheezing or rales.  Abdominal:     General: Bowel sounds are normal. There is no distension.     Palpations: Abdomen is soft. There is no mass.     Tenderness: There is no  abdominal tenderness.  Musculoskeletal: Normal range of motion.        General: No tenderness.     Comments: Upper / Lower Extremities: - Normal muscle tone, strength bilateral upper extremities 5/5, lower extremities 5/5  Lymphadenopathy:     Cervical: No cervical adenopathy.  Skin:    General: Skin is warm and dry.     Findings: No erythema or rash.  Neurological:     Mental Status: She is alert and oriented to person, place, and time.     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     Comments: Well groomed, good eye contact, normal speech and thoughts    Results for orders placed or performed in visit on 04/14/19  TSH  Result Value Ref Range   TSH 2.75 0.40 - 4.50 mIU/L  Lipid panel  Result Value Ref Range   Cholesterol 175 <200 mg/dL   HDL 72 > OR = 50 mg/dL   Triglycerides 62 <150 mg/dL   LDL Cholesterol (Calc) 89 mg/dL (calc)   Total CHOL/HDL Ratio 2.4 <5.0 (calc)   Non-HDL Cholesterol (Calc) 103 <130 mg/dL (calc)  COMPLETE METABOLIC PANEL WITH GFR  Result Value Ref Range   Glucose, Bld 81 65 - 99 mg/dL   BUN 19 7 - 25 mg/dL   Creat 0.81 0.60 - 0.88 mg/dL   GFR, Est Non African American 66 > OR = 60 mL/min/1.65m2   GFR, Est African American 77 > OR = 60 mL/min/1.4m2   BUN/Creatinine Ratio NOT APPLICABLE 6 - 22 (calc)   Sodium 135 135 - 146 mmol/L   Potassium 4.1 3.5 - 5.3 mmol/L   Chloride 99 98 - 110 mmol/L   CO2 27 20 - 32 mmol/L   Calcium 9.4 8.6 - 10.4 mg/dL   Total Protein 6.6 6.1 - 8.1 g/dL   Albumin  4.2 3.6 - 5.1 g/dL   Globulin 2.4 1.9 - 3.7 g/dL (calc)   AG Ratio 1.8 1.0 - 2.5 (calc)   Total Bilirubin 0.7 0.2 - 1.2 mg/dL   Alkaline phosphatase (APISO) 60 37 - 153 U/L   AST 18 10 - 35 U/L   ALT 10 6 - 29 U/L  CBC with Differential/Platelet  Result Value Ref Range   WBC 4.3 3.8 - 10.8 Thousand/uL    RBC 4.66 3.80 - 5.10 Million/uL   Hemoglobin 13.8 11.7 - 15.5 g/dL   HCT 40.2 35.0 - 45.0 %   MCV 86.3 80.0 - 100.0 fL   MCH 29.6 27.0 - 33.0 pg   MCHC 34.3 32.0 - 36.0 g/dL   RDW 13.2 11.0 - 15.0 %   Platelets 174 140 - 400 Thousand/uL   MPV 10.5 7.5 - 12.5 fL   Neutro Abs 1,866 1,500 - 7,800 cells/uL   Lymphs Abs 1,694 850 - 3,900 cells/uL   Absolute Monocytes 499 200 - 950 cells/uL   Eosinophils Absolute 189 15 - 500 cells/uL   Basophils Absolute 52 0 - 200 cells/uL   Neutrophils Relative % 43.4 %   Total Lymphocyte 39.4 %   Monocytes Relative 11.6 %   Eosinophils Relative 4.4 %   Basophils Relative 1.2 %  Hemoglobin A1c  Result Value Ref Range   Hgb A1c MFr Bld 5.4 <5.7 % of total Hgb   Mean Plasma Glucose 108 (calc)   eAG (mmol/L) 6.0 (calc)      Assessment & Plan:   Problem List Items Addressed This Visit    Elevated hemoglobin A1c    Well-controlled Pre-DM with A1c 5.4, stable, under range of PreDM Concern with HTN  Plan:  1. Not on any therapy currently  2. Encourage improved lifestyle - low carb, low sugar diet - given BMI 20 and prior wt loss, encouraged to continue higher protein diet, should not limit calories based on concern for Pre-DM. Continue regular exercise  Check yearly      Elevated LDL cholesterol level    Controlled cholesterol on lifestyle Last lipid panel 03/2019, improved now normal Calculated ASCVD 10 yr risk score still >30%, mainly d/t age  Plan: 1. Continue current meds - Fish Oil daily - Continue ASA 81mg  daily for primary prevention 2. Briefly reviewed discussion again on future ASCVD risk and prevention, no prior CAD/MI/CVA - offered statin therapy - reviewed this and possible starting dose intermittent Rosuvastatin, but mutual decision agree to hold off for now and can add later 3. Encourage improved lifestyle - continue current diet and exercise 4. Follow-up q 12 months      Essential hypertension    Similar to previous, again  acutely elevated BP in office, improved after re-check but still elevated. Multiple occasions her home blood pressure cuff has been calibrated Home readings available today 120-130s appropriate Seems to be most consistent with white coat HTN No other known complication. History of hypotension / syncope Failed Amlodipine (swelling). Discontinued Triamterene-HCTZ in past.  Plan:  1. Continue current med Lisinopril 20mg  BID 2. Encourage improved lifestyle - continue low sodium diet, regular exercise 3. Continue close monitor BP outside office weekly, bring readings 4. Follow-up yearly - sooner in 6 months if needed      Hypothyroidism (Chronic)    Asymptomatic Stable, controlled Last lab TSH normal. Continue Levothyroxine 36mcg daily      Rheumatoid arthritis involving both hands (HCC)    Stable chronic problem Joint hand pain  with episodic swelling and pain Taking NSAID PRN       Other Visit Diagnoses    Annual physical exam    -  Primary      Updated Health Maintenance information Reviewed recent lab results with patient Encouraged improvement to lifestyle with diet and exercise Goal maintain weight   No orders of the defined types were placed in this encounter.    Follow up plan: Return in about 1 year (around 04/17/2020) for Annual Physical.  Future labs 04/12/2020 - Optional 6 months for HTN and Flu Vaccine  [name], DO [facility] 04/18/2019, 2:15 PM

## 2019-04-18 NOTE — Assessment & Plan Note (Signed)
Asymptomatic Stable, controlled Last lab TSH normal. Continue Levothyroxine 68mg daily

## 2019-04-18 NOTE — Assessment & Plan Note (Signed)
Stable chronic problem Joint hand pain with episodic swelling and pain Taking NSAID PRN

## 2019-04-18 NOTE — Assessment & Plan Note (Signed)
Similar to previous, again acutely elevated BP in office, improved after re-check but still elevated. Multiple occasions her home blood pressure cuff has been calibrated Home readings available today 120-130s appropriate Seems to be most consistent with white coat HTN No other known complication. History of hypotension / syncope Failed Amlodipine (swelling). Discontinued Triamterene-HCTZ in past.  Plan:  1. Continue current med Lisinopril 20mg  BID 2. Encourage improved lifestyle - continue low sodium diet, regular exercise 3. Continue close monitor BP outside office weekly, bring readings 4. Follow-up yearly - sooner in 6 months if needed

## 2019-04-18 NOTE — Assessment & Plan Note (Signed)
Well-controlled Pre-DM with A1c 5.4, stable, under range of PreDM Concern with HTN  Plan:  1. Not on any therapy currently  2. Encourage improved lifestyle - low carb, low sugar diet - given BMI 20 and prior wt loss, encouraged to continue higher protein diet, should not limit calories based on concern for Pre-DM. Continue regular exercise  Check yearly

## 2019-04-18 NOTE — Assessment & Plan Note (Signed)
Controlled cholesterol on lifestyle Last lipid panel 03/2019, improved now normal Calculated ASCVD 10 yr risk score still >30%, mainly d/t age  Plan: 1. Continue current meds - Fish Oil daily - Continue ASA 81mg  daily for primary prevention 2. Briefly reviewed discussion again on future ASCVD risk and prevention, no prior CAD/MI/CVA - offered statin therapy - reviewed this and possible starting dose intermittent Rosuvastatin, but mutual decision agree to hold off for now and can add later 3. Encourage improved lifestyle - continue current diet and exercise 4. Follow-up q 12 months

## 2019-04-18 NOTE — Patient Instructions (Addendum)
Thank you for coming to the office today.  1. Chemistry - Normal results, including electrolytes, kidney and liver function. Normal fasting blood sugar  2. Hemoglobin A1c (Pre-Diabetes screening) - 5.4, improved still, in normal not in range of Pre-Diabetes (>5.7 to 6.4)   3. TSH Thyroid Function Tests - Normal. Controlled on Levothyroxine 53mcg daily  4. Cholesterol - Normal cholesterol results. Controlled currently and on FIsh Oil.  5. CBC Blood Counts - Normal, no anemia, other abnormality  Keep up the good work!  Optional 6 months - for Flu Vaccine and Follow-up Blood Pressure.  DUE for FASTING BLOOD WORK (no food or drink after midnight before the lab appointment, only water or coffee without cream/sugar on the morning of)  SCHEDULE "Lab Only" visit in the morning at the clinic for lab draw in 1 YEAR  - Make sure Lab Only appointment is at about 1 week before your next appointment, so that results will be available  For Lab Results, once available within 2-3 days of blood draw, you can can log in to MyChart online to view your results and a brief explanation. Also, we can discuss results at next follow-up visit.   Please schedule a Follow-up Appointment to: Return in about 1 year (around 04/17/2020) for Annual Physical.  If you have any other questions or concerns, please feel free to call the office or send a message through [facility]. You may also schedule an earlier appointment if necessary.  Additionally, you may be receiving a survey about your experience at our office within a few days to 1 week by e-mail or mail. We value your feedback.  [name], DO [facility]

## 2019-05-15 ENCOUNTER — Other Ambulatory Visit: Payer: Self-pay | Admitting: Family Medicine

## 2019-05-15 DIAGNOSIS — Z803 Family history of malignant neoplasm of breast: Secondary | ICD-10-CM

## 2019-05-15 DIAGNOSIS — Z1239 Encounter for other screening for malignant neoplasm of breast: Secondary | ICD-10-CM

## 2019-06-27 ENCOUNTER — Ambulatory Visit
Admission: RE | Admit: 2019-06-27 | Discharge: 2019-06-27 | Disposition: A | Discharge status: Home / Self Care | Payer: Medicare Other | Source: Ambulatory Visit | Attending: Family Medicine | Admitting: Family Medicine

## 2019-06-27 ENCOUNTER — Other Ambulatory Visit: Payer: Self-pay

## 2019-06-27 DIAGNOSIS — Z1231 Encounter for screening mammogram for malignant neoplasm of breast: Secondary | ICD-10-CM | POA: Insufficient documentation

## 2019-06-27 DIAGNOSIS — Z803 Family history of malignant neoplasm of breast: Secondary | ICD-10-CM | POA: Diagnosis not present

## 2019-06-27 DIAGNOSIS — Z1239 Encounter for other screening for malignant neoplasm of breast: Secondary | ICD-10-CM

## 2019-08-05 ENCOUNTER — Ambulatory Visit (INDEPENDENT_AMBULATORY_CARE_PROVIDER_SITE_OTHER): Payer: Medicare Other

## 2019-08-05 ENCOUNTER — Ambulatory Visit: Payer: Medicare Other

## 2019-08-05 VITALS — BP 126/74 | HR 70 | Ht 66.0 in | Wt 124.0 lb

## 2019-08-05 DIAGNOSIS — Z0000 Encounter for general adult medical examination without abnormal findings: Secondary | ICD-10-CM

## 2019-08-05 NOTE — Patient Instructions (Signed)
Brittany Oneal , Thank you for taking time to come for your Medicare Wellness Visit. I appreciate your ongoing commitment to your health goals. Please review the following plan we discussed and let me know if I can assist you in the future.   Screening recommendations/referrals: Colonoscopy: no longer required Mammogram: completed 07/07/2019 Bone Density: completed 05/04/2016 Recommended yearly ophthalmology/optometry visit for glaucoma screening and checkup Recommended yearly dental visit for hygiene and checkup  Vaccinations: Influenza vaccine: due now Pneumococcal vaccine: up to date Tdap vaccine: up to date Shingles vaccine: shingrix eligible.    Advanced directives: copy on file   Conditions/risks identified: recommend walking 3 times a week for approx 20-30 min.   Next appointment: Follow up in one year for your annual wellness visit.    Preventive Care 10 Years and Older, Female Preventive care refers to lifestyle choices and visits with your health care provider that can promote health and wellness. What does preventive care include?  A yearly physical exam. This is also called an annual well check.  Dental exams once or twice a year.  Routine eye exams. Ask your health care provider how often you should have your eyes checked.  Personal lifestyle choices, including:  Daily care of your teeth and gums.  Regular physical activity.  Eating a healthy diet.  Avoiding tobacco and drug use.  Limiting alcohol use.  Practicing safe sex.  Taking low-dose aspirin every day.  Taking vitamin and mineral supplements as recommended by your health care provider. What happens during an annual well check? The services and screenings done by your health care provider during your annual well check will depend on your age, overall health, lifestyle risk factors, and family history of disease. Counseling  Your health care provider may ask you questions about your:  Alcohol use.   Tobacco use.  Drug use.  Emotional well-being.  Home and relationship well-being.  Sexual activity.  Eating habits.  History of falls.  Memory and ability to understand (cognition).  Work and work Statistician.  Reproductive health. Screening  You may have the following tests or measurements:  Height, weight, and BMI.  Blood pressure.  Lipid and cholesterol levels. These may be checked every 5 years, or more frequently if you are over 62 years old.  Skin check.  Lung cancer screening. You may have this screening every year starting at age 73 if you have a 30-pack-year history of smoking and currently smoke or have quit within the past 15 years.  Fecal occult blood test (FOBT) of the stool. You may have this test every year starting at age 7.  Flexible sigmoidoscopy or colonoscopy. You may have a sigmoidoscopy every 5 years or a colonoscopy every 10 years starting at age 57.  Hepatitis C blood test.  Hepatitis B blood test.  Sexually transmitted disease (STD) testing.  Diabetes screening. This is done by checking your blood sugar (glucose) after you have not eaten for a while (fasting). You may have this done every 1-3 years.  Bone density scan. This is done to screen for osteoporosis. You may have this done starting at age 50.  Mammogram. This may be done every 1-2 years. Talk to your health care provider about how often you should have regular mammograms. Talk with your health care provider about your test results, treatment options, and if necessary, the need for more tests. Vaccines  Your health care provider may recommend certain vaccines, such as:  Influenza vaccine. This is recommended every year.  Tetanus,  diphtheria, and acellular pertussis (Tdap, Td) vaccine. You may need a Td booster every 10 years.  Zoster vaccine. You may need this after age 32.  Pneumococcal 13-valent conjugate (PCV13) vaccine. One dose is recommended after age 12.  Pneumococcal  polysaccharide (PPSV23) vaccine. One dose is recommended after age 45. Talk to your health care provider about which screenings and vaccines you need and how often you need them. This information is not intended to replace advice given to you by your health care provider. Make sure you discuss any questions you have with your health care provider. Document Released: 12/10/2015 Document Revised: 08/02/2016 Document Reviewed: 09/14/2015 Elsevier Interactive Patient Education  2017 [place] Prevention in the Home Falls can cause injuries. They can happen to people of all ages. There are many things you can do to make your home safe and to help prevent falls. What can I do on the outside of my home?  Regularly fix the edges of walkways and driveways and fix any cracks.  Remove anything that might make you trip as you walk through a door, such as a raised step or threshold.  Trim any bushes or trees on the path to your home.  Use bright outdoor lighting.  Clear any walking paths of anything that might make someone trip, such as rocks or tools.  Regularly check to see if handrails are loose or broken. Make sure that both sides of any steps have handrails.  Any raised decks and porches should have guardrails on the edges.  Have any leaves, snow, or ice cleared regularly.  Use sand or salt on walking paths during winter.  Clean up any spills in your garage right away. This includes oil or grease spills. What can I do in the bathroom?  Use night lights.  Install grab bars by the toilet and in the tub and shower. Do not use towel bars as grab bars.  Use non-skid mats or decals in the tub or shower.  If you need to sit down in the shower, use a plastic, non-slip stool.  Keep the floor dry. Clean up any water that spills on the floor as soon as it happens.  Remove soap buildup in the tub or shower regularly.  Attach bath mats securely with double-sided non-slip rug tape.   Do not have throw rugs and other things on the floor that can make you trip. What can I do in the bedroom?  Use night lights.  Make sure that you have a light by your bed that is easy to reach.  Do not use any sheets or blankets that are too big for your bed. They should not hang down onto the floor.  Have a firm chair that has side arms. You can use this for support while you get dressed.  Do not have throw rugs and other things on the floor that can make you trip. What can I do in the kitchen?  Clean up any spills right away.  Avoid walking on wet floors.  Keep items that you use a lot in easy-to-reach places.  If you need to reach something above you, use a strong step stool that has a grab bar.  Keep electrical cords out of the way.  Do not use floor polish or wax that makes floors slippery. If you must use wax, use non-skid floor wax.  Do not have throw rugs and other things on the floor that can make you trip. What can I do with  my stairs?  Do not leave any items on the stairs.  Make sure that there are handrails on both sides of the stairs and use them. Fix handrails that are broken or loose. Make sure that handrails are as long as the stairways.  Check any carpeting to make sure that it is firmly attached to the stairs. Fix any carpet that is loose or worn.  Avoid having throw rugs at the top or bottom of the stairs. If you do have throw rugs, attach them to the floor with carpet tape.  Make sure that you have a light switch at the top of the stairs and the bottom of the stairs. If you do not have them, ask someone to add them for you. What else can I do to help prevent falls?  Wear shoes that:  Do not have high heels.  Have rubber bottoms.  Are comfortable and fit you well.  Are closed at the toe. Do not wear sandals.  If you use a stepladder:  Make sure that it is fully opened. Do not climb a closed stepladder.  Make sure that both sides of the  stepladder are locked into place.  Ask someone to hold it for you, if possible.  Clearly mark and make sure that you can see:  Any grab bars or handrails.  First and last steps.  Where the edge of each step is.  Use tools that help you move around (mobility aids) if they are needed. These include:  Canes.  Walkers.  Scooters.  Crutches.  Turn on the lights when you go into a dark area. Replace any light bulbs as soon as they burn out.  Set up your furniture so you have a clear path. Avoid moving your furniture around.  If any of your floors are uneven, fix them.  If there are any pets around you, be aware of where they are.  Review your medicines with your doctor. Some medicines can make you feel dizzy. This can increase your chance of falling. Ask your doctor what other things that you can do to help prevent falls. This information is not intended to replace advice given to you by your health care provider. Make sure you discuss any questions you have with your health care provider. Document Released: 09/09/2009 Document Revised: 04/20/2016 Document Reviewed: 12/18/2014 Elsevier Interactive Patient Education  2017 [employer].

## 2019-08-05 NOTE — Progress Notes (Signed)
Subjective:   Brittany Oneal is a 85 y.o. female who presents for Medicare Annual (Subsequent) preventive examination.  This visit is being conducted via phone call  - after an attmept to do on video chat - due to the COVID-19 pandemic. This patient has given me verbal consent via phone to conduct this visit, patient states they are participating from their home address. Some vital signs may be absent or patient reported.   Patient identification: identified by name, DOB, and current address.    Review of Systems:   Cardiac Risk Factors include: advanced age (>66men, >86 women);hypertension     Objective:     Vitals: BP 126/74 Comment: pt reported  Pulse 70 Comment: pt reported  Ht 5\' 6"  (1.676 m)   Wt 124 lb (56.2 kg) Comment: pt reported  BMI 20.01 kg/m   Body mass index is 20.01 kg/m.  Advanced Directives 08/05/2019 07/23/2018 10/24/2017 07/17/2017  Does Patient Have a Medical Advance Directive? Yes Yes Yes Yes  Type of Advance Directive Living will;Healthcare Power of Attorney Living will;Healthcare Power of Attorney Living will -  Copy of [place] in Chart? Yes - validated most recent copy scanned in chart (See row information) No - copy requested - -    Tobacco Social History   Tobacco Use  Smoking Status Never Smoker  Smokeless Tobacco Never Used     Counseling given: Not Answered   Clinical Intake:  Pre-visit preparation completed: Yes  Pain : No/denies pain     Nutritional Status: BMI of 19-24  Normal Nutritional Risks: None Diabetes: No  How often do you need to have someone help you when you read instructions, pamphlets, or other written materials from your doctor or pharmacy?: 1 - Never  Interpreter Needed?: No  Information entered by :: Brittany Hill,LPN  Past Medical History:  Diagnosis Date  . Arthritis   . Family history of malignant neoplasm of breast   . Family history of malignant neoplasm of gastrointestinal tract    . History of colon cancer   . Personal history of malignant neoplasm of large intestine   . Special screening for malignant neoplasms, colon   . Thyroid disease    Past Surgical History:  Procedure Laterality Date  . ABDOMINAL HYSTERECTOMY  1983  . COLON SURGERY  1990   resection d/t cancer  . COLONOSCOPY  1997,2001,2004,2006, 2011  . COLONOSCOPY WITH PROPOFOL N/A 10/24/2017   Procedure: COLONOSCOPY WITH PROPOFOL;  Surgeon: Christene Lye, MD;  Location: [facility] ENDOSCOPY;  Service: Endoscopy;  Laterality: N/A;  . EYE SURGERY  Sept 2013  . 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 SALPINGOOPHORECTOMY     Family History  Problem Relation Age of Onset  . Hypertension Mother   . Hypertension Father   . Diabetes Father   . Cancer Other        FH of breast and colon cancer  . Cancer Brother        colon  . Cancer Sister        breast  . Breast cancer Sister 73   Social History   Socioeconomic History  . Marital status: Married    Spouse name: Not on file  . Number of children: Not on file  . Years of education: Not on file  . Highest education level: High school graduate  Occupational History  . Not on file  Social Needs  . Financial resource strain: Not hard  at all  . Food insecurity    Worry: Never true    Inability: Never true  . Transportation needs    Medical: No    Non-medical: No  Tobacco Use  . Smoking status: Never Smoker  . Smokeless tobacco: Never Used  Substance and Sexual Activity  . Alcohol use: No    Alcohol/week: 0.0 standard drinks  . Drug use: No  . Sexual activity: Not on file  Lifestyle  . Physical activity    Days per week: 1 day    Minutes per session: 120 min  . Stress: Not at all  Relationships  . Social connections    Talks on phone: More than three times a week    Gets together: More than three times a week    Attends religious service: More than 4 times per year    Active member of club or organization: No     Attends meetings of clubs or organizations: Never    Relationship status: Married  Other Topics Concern  . Not on file  Social History Narrative   Gym one to 2 days  Week     Outpatient Encounter Medications as of 08/05/2019  Medication Sig  . aspirin EC 81 MG tablet Take 1 tablet (81 mg total) by mouth daily.  . calcium carbonate (OS-CAL - DOSED IN MG OF ELEMENTAL CALCIUM) 1250 MG tablet Take 1 tablet by mouth. With vitamin d  . Glucosamine-Chondroitin (GLUCOSAMINE CHONDR COMPLEX) 500-400 MG CAPS Take 2 capsules by mouth daily.  Marland Kitchen levothyroxine (SYNTHROID) 50 MCG tablet TAKE 1 TABLET DAILY  . lisinopril (ZESTRIL) 40 MG tablet TAKE ONE-HALF (1/2) TABLET TWICE A DAY  . Omega-3 Fatty Acids (FISH OIL) 1000 MG CAPS Take 1 capsule by mouth daily.    No facility-administered encounter medications on file as of 08/05/2019.     Activities of Daily Living In your present state of health, do you have any difficulty performing the following activities: 08/05/2019  Hearing? N  Comment no hearing aids  Vision? Y  Comment eyeglasses, [place] eye  Difficulty concentrating or making decisions? N  Walking or climbing stairs? N  Dressing or bathing? N  Doing errands, shopping? N  Preparing Food and eating ? N  Using the Toilet? N  In the past six months, have you accidently leaked urine? N  Do you have problems with loss of bowel control? N  Managing your Medications? N  Managing your Finances? N  Housekeeping or managing your Housekeeping? N  Some recent data might be hidden    Patient Care Team: Olin Hauser, DO as PCP - General (Family Medicine) Christene Lye, MD (General Surgery) Dasher, Rayvon Char, MD (Dermatology)    Assessment:   This is a routine wellness examination for Brittany Oneal.  Exercise Activities and Dietary recommendations Current Exercise Habits: The patient does not participate in regular exercise at present, Exercise limited by: None identified  Goals    .  Exercise 3x per week (30 min per time)     Patient and her spouse exercise twice a weekly. She says it would be a goal to be able to increase number of days. Her overall goal is to live a healthy life.    . Increase water intake     Recommend drinking at least 4-5 glasses of water a day        Fall Risk: Fall Risk  08/05/2019 10/18/2018 07/23/2018 05/27/2018 02/05/2018  Falls in the past year? 0 0 No No  No  Number falls in past yr: - - - - -  Injury with Fall? - - - - -  Follow up - Falls evaluation completed - - -    FALL RISK PREVENTION PERTAINING TO THE HOME:  Any stairs in or around the home? No  If so, are there any without handrails? n/a  Home free of loose throw rugs in walkways, pet beds, electrical cords, etc? Yes  Adequate lighting in your home to reduce risk of falls? Yes   ASSISTIVE DEVICES UTILIZED TO PREVENT FALLS:  Life alert? No  Use of a cane, walker or w/c? No  Grab bars in the bathroom? Yes  Shower chair or bench in shower? No  Elevated toilet seat or a handicapped toilet? No   DME ORDERS:  DME order needed?  No   TIMED UP AND GO:  Unable to perform    Depression Screen PHQ 2/9 Scores 08/05/2019 04/18/2019 10/18/2018 07/23/2018  PHQ - 2 Score 0 0 0 0     Cognitive Function MMSE - Mini Mental State Exam 08/22/2016  Orientation to time 5  Orientation to Place 5  Registration 3  Attention/ Calculation 5  Recall 3  Language- name 2 objects 2  Language- repeat 1  Language- follow 3 step command 3  Language- read & follow direction 1  Write a sentence 1  Copy design 0  Total score 29     6CIT Screen 07/23/2018 07/17/2017  What Year? 0 points 0 points  What month? 0 points 0 points  What time? 0 points 0 points  Count back from 20 0 points 0 points  Months in reverse 0 points 0 points  Repeat phrase 2 points 2 points  Total Score 2 2    Immunization History  Administered Date(s) Administered  . Influenza, High Dose Seasonal PF 08/03/2015,  08/22/2016, 07/31/2017, 09/04/2018  . Influenza, Seasonal, Injecte, Preservative Fre 08/20/2013  . Influenza-Unspecified 08/03/2015  . Pneumococcal Conjugate-13 09/29/2014, 10/07/2015  . Pneumococcal Polysaccharide-23 11/27/1994  . Tdap 01/21/2014    Qualifies for Shingles Vaccine? Yes  Zostavax completed n/a. Due for Shingrix. Education has been provided regarding the importance of this vaccine. Pt has been advised to call insurance company to determine out of pocket expense. Advised may also receive vaccine at local pharmacy or Health Dept. Verbalized acceptance and understanding.  Tdap: up to date   Flu Vaccine: Due now   Pneumococcal Vaccine:  Up to date   Screening Tests Health Maintenance  Topic Date Due  . INFLUENZA VACCINE  06/28/2019  . TETANUS/TDAP  01/22/2024  . DEXA SCAN  Completed  . PNA vac Low Risk Adult  Completed    Cancer Screenings:  Colorectal Screening: no longer required  Mammogram: completed 07/07/2019  Bone Density: Completed 05/04/2016  Lung Cancer Screening: (Low Dose CT Chest recommended if Age 66-80 years, 30 pack-year currently smoking OR have quit w/in 15years.) does not qualify.     Additional Screening:  Hepatitis C Screening: does not qualify  Vision Screening: Recommended annual ophthalmology exams for early detection of glaucoma and other disorders of the eye. Is the patient up to date with their annual eye exam?  Yes  Who is the provider or what is the name of the office in which the pt attends annual eye exams? Dr.Dingledin    Dental Screening: Recommended annual dental exams for proper oral hygiene  Community Resource Referral:  CRR required this visit?  No       Plan:  I have personally reviewed and addressed the Medicare Annual Wellness questionnaire and have noted the following in the patient's chart:  A. Medical and social history B. Use of alcohol, tobacco or illicit drugs  C. Current medications and supplements D.  Functional ability and status E.  Nutritional status F.  Physical activity G. Advance directives H. List of other physicians I.  Hospitalizations, surgeries, and ER visits in previous 12 months J.  [place] such as hearing and vision if needed, cognitive and depression L. Referrals and appointments   In addition, I have reviewed and discussed with patient certain preventive protocols, quality metrics, and best practice recommendations. A written personalized care plan for preventive services as well as general preventive health recommendations were provided to patient.  Signed,    Bevelyn Ngo, LPN  [id number] Nurse Health Advisor   Nurse Notes:none

## 2019-08-21 ENCOUNTER — Other Ambulatory Visit: Payer: Self-pay

## 2019-08-21 ENCOUNTER — Ambulatory Visit (INDEPENDENT_AMBULATORY_CARE_PROVIDER_SITE_OTHER): Payer: Medicare Other

## 2019-08-21 DIAGNOSIS — Z23 Encounter for immunization: Secondary | ICD-10-CM | POA: Diagnosis not present

## 2019-12-10 ENCOUNTER — Other Ambulatory Visit: Payer: Self-pay | Admitting: Family Medicine

## 2019-12-10 DIAGNOSIS — E039 Hypothyroidism, unspecified: Secondary | ICD-10-CM

## 2019-12-10 MED ORDER — LEVOTHYROXINE SODIUM 50 MCG PO TABS: 50.0000 ug | ORAL_TABLET | Freq: Every day | ORAL | 3 refills | Status: DC

## 2020-02-09 ENCOUNTER — Other Ambulatory Visit: Payer: Self-pay | Admitting: Family Medicine

## 2020-02-09 DIAGNOSIS — I10 Essential (primary) hypertension: Secondary | ICD-10-CM

## 2020-04-12 ENCOUNTER — Other Ambulatory Visit: Payer: Self-pay

## 2020-04-12 ENCOUNTER — Other Ambulatory Visit: Payer: Medicare Other

## 2020-04-12 DIAGNOSIS — R7309 Other abnormal glucose: Secondary | ICD-10-CM

## 2020-04-12 DIAGNOSIS — I10 Essential (primary) hypertension: Secondary | ICD-10-CM

## 2020-04-12 DIAGNOSIS — M069 Rheumatoid arthritis, unspecified: Secondary | ICD-10-CM

## 2020-04-12 DIAGNOSIS — E039 Hypothyroidism, unspecified: Secondary | ICD-10-CM

## 2020-04-12 DIAGNOSIS — Z0000 Encounter for general adult medical examination without abnormal findings: Secondary | ICD-10-CM

## 2020-04-12 DIAGNOSIS — E7800 Pure hypercholesterolemia, unspecified: Secondary | ICD-10-CM

## 2020-04-13 LAB — HEMOGLOBIN A1C
Hgb A1c MFr Bld: 5.3 % of total Hgb (ref <5.7)
Mean Plasma Glucose: 105 (calc)
eAG (mmol/L): 5.8 (calc)

## 2020-04-13 LAB — COMPLETE METABOLIC PANEL WITH GFR
AG Ratio: 1.9 (calc) (ref 1.0–2.5)
ALT: 10 U/L (ref 6–29)
AST: 17 U/L (ref 10–35)
Albumin: 4.1 g/dL (ref 3.6–5.1)
Alkaline phosphatase (APISO): 52 U/L (ref 37–153)
BUN: 19 mg/dL (ref 7–25)
CO2: 30 mmol/L (ref 20–32)
Calcium: 8.9 mg/dL (ref 8.6–10.4)
Chloride: 98 mmol/L (ref 98–110)
Creat: 0.77 mg/dL (ref 0.60–0.88)
GFR, Est African American: 81 mL/min/{1.73_m2} (ref >=60)
GFR, Est Non African American: 70 mL/min/{1.73_m2} (ref >=60)
Globulin: 2.2 g/dL (calc) (ref 1.9–3.7)
Glucose, Bld: 86 mg/dL (ref 65–99)
Potassium: 4.4 mmol/L (ref 3.5–5.3)
Sodium: 134 mmol/L — ABNORMAL LOW (ref 135–146)
Total Bilirubin: 0.6 mg/dL (ref 0.2–1.2)
Total Protein: 6.3 g/dL (ref 6.1–8.1)

## 2020-04-13 LAB — CBC WITH DIFFERENTIAL/PLATELET
Absolute Monocytes: 583 cells/uL (ref 200–950)
Basophils Absolute: 39 cells/uL (ref 0–200)
Basophils Relative: 0.8 %
Eosinophils Absolute: 201 cells/uL (ref 15–500)
Eosinophils Relative: 4.1 %
HCT: 39.1 % (ref 35.0–45.0)
Hemoglobin: 13 g/dL (ref 11.7–15.5)
Lymphs Abs: 2190 cells/uL (ref 850–3900)
MCH: 29.4 pg (ref 27.0–33.0)
MCHC: 33.2 g/dL (ref 32.0–36.0)
MCV: 88.5 fL (ref 80.0–100.0)
MPV: 10 fL (ref 7.5–12.5)
Monocytes Relative: 11.9 %
Neutro Abs: 1887 cells/uL (ref 1500–7800)
Neutrophils Relative %: 38.5 %
Platelets: 194 10*3/uL (ref 140–400)
RBC: 4.42 10*6/uL (ref 3.80–5.10)
RDW: 12.8 % (ref 11.0–15.0)
Total Lymphocyte: 44.7 %
WBC: 4.9 10*3/uL (ref 3.8–10.8)

## 2020-04-13 LAB — LIPID PANEL
Cholesterol: 180 mg/dL (ref <200)
HDL: 78 mg/dL (ref >=50)
LDL Cholesterol (Calc): 88 mg/dL (calc)
Non-HDL Cholesterol (Calc): 102 mg/dL (calc) (ref <130)
Total CHOL/HDL Ratio: 2.3 (calc) (ref <5.0)
Triglycerides: 59 mg/dL (ref <150)

## 2020-04-13 LAB — TSH: TSH: 3.34 mIU/L (ref 0.40–4.50)

## 2020-04-13 LAB — T4, FREE: Free T4: 1.1 ng/dL (ref 0.8–1.8)

## 2020-04-16 ENCOUNTER — Encounter: Payer: Self-pay | Admitting: Family Medicine

## 2020-04-16 ENCOUNTER — Ambulatory Visit (INDEPENDENT_AMBULATORY_CARE_PROVIDER_SITE_OTHER): Payer: Medicare Other | Admitting: Family Medicine

## 2020-04-16 ENCOUNTER — Other Ambulatory Visit: Payer: Medicare Other

## 2020-04-16 ENCOUNTER — Other Ambulatory Visit: Payer: Self-pay

## 2020-04-16 VITALS — BP 134/80 | HR 89 | Temp 97.5°F | Resp 16 | Ht 66.0 in | Wt 124.6 lb

## 2020-04-16 DIAGNOSIS — I10 Essential (primary) hypertension: Secondary | ICD-10-CM | POA: Diagnosis not present

## 2020-04-16 DIAGNOSIS — R3915 Urgency of urination: Secondary | ICD-10-CM

## 2020-04-16 DIAGNOSIS — R7309 Other abnormal glucose: Secondary | ICD-10-CM | POA: Diagnosis not present

## 2020-04-16 DIAGNOSIS — Z0000 Encounter for general adult medical examination without abnormal findings: Secondary | ICD-10-CM | POA: Diagnosis not present

## 2020-04-16 DIAGNOSIS — Z1231 Encounter for screening mammogram for malignant neoplasm of breast: Secondary | ICD-10-CM

## 2020-04-16 DIAGNOSIS — K5901 Slow transit constipation: Secondary | ICD-10-CM

## 2020-04-16 DIAGNOSIS — Z803 Family history of malignant neoplasm of breast: Secondary | ICD-10-CM

## 2020-04-16 DIAGNOSIS — M069 Rheumatoid arthritis, unspecified: Secondary | ICD-10-CM | POA: Diagnosis not present

## 2020-04-16 DIAGNOSIS — E039 Hypothyroidism, unspecified: Secondary | ICD-10-CM

## 2020-04-16 DIAGNOSIS — J3089 Other allergic rhinitis: Secondary | ICD-10-CM

## 2020-04-16 LAB — POCT URINALYSIS DIPSTICK
Bilirubin, UA: NEGATIVE
Blood, UA: NEGATIVE
Glucose, UA: NEGATIVE
Ketones, UA: NEGATIVE
Leukocytes, UA: NEGATIVE
Nitrite, UA: NEGATIVE
Protein, UA: NEGATIVE
Spec Grav, UA: 1.01 (ref 1.010–1.025)
Urobilinogen, UA: 0.2 E.U./dL
pH, UA: 5 (ref 5.0–8.0)

## 2020-04-16 MED ORDER — IPRATROPIUM BROMIDE 0.06 % NA SOLN: 2.0000 | Freq: Four times a day (QID) | NASAL | 5 refills | Status: AC | PRN

## 2020-04-16 NOTE — Progress Notes (Signed)
Subjective:    Patient ID: Brittany Oneal, female    DOB: 01-27-1931, 86 y.o.   MRN: [id number]  Brittany Oneal is a 86 y.o. female presenting on 04/16/2020 for Annual Exam   HPI   Here for Annual Physical and Lab Review.  CHRONIC HTN: Reports has some elevated readings in office but home BP readings are normal range 120-130s Lab showed normal creatinine Current Meds - Lisinopril 20mg  BID (half of 40mg ) Reports good compliance, took meds today. Tolerating well, w/o complaints - Taking ASA 81mg  daily tolerating well  Hypothyroidism, chronic Last lab normal TSH Stable chronic problem, still taking Levothyroxine 54mcg daily She denies any symptoms of low thyroid hormone at this time  Elevated A1c Prior A1c 5.6 in past, but has been normal range since. - now lab A1c 5.3 Meds:Never on medication for blood sugar Currently on ACEi Lifestyle: - Weight is stable - Diet (Still focusing on healthy diet, avoiding sweets, improving appetite,drinks mostly water, some tea, coffee)  - Exercise (Regular exercises mostly outdoor and at home) - Family history of DM2 (Father) Denies hypoglycemia  Mild Elevated LDL - Reports no concerns. Last lipid panel 03/2020 normal -Currently takingFish Oil omega 3 1g daily - No known history ofCAD, MI, CVA - She has been taking ASA 81mg  daily since last visit, doing well without bleeding or other episodes - She has never been on Statin medicine, has declined this in the past  Constipation Admits straining with BMs, with constipation with some harder or dryer stool with smaller pieces or amount coming out. She is asking about adding fiber  Seasonal Allergies  Rheumatoid Arthritis Bilateral hands. Chronic problem. No new worsening or flare or joint pain.\   Health Maintenance: UTD COVID19 vaccine up to date., PNA vaccine UTD  Fam history of breast cancer - sisters and aunts. She used to be followed by General Surgery and mammogram yearly,  last done 06/2019 was negative. Now due for repeat.  Depression screen [facility] 2/9 04/16/2020 08/05/2019 04/18/2019  Decreased Interest 0 0 0  Down, Depressed, Hopeless 0 0 0  PHQ - 2 Score 0 0 0    Past Medical History:  Diagnosis Date  . Arthritis   . Family history of malignant neoplasm of breast   . Family history of malignant neoplasm of gastrointestinal tract   . History of colon cancer   . Personal history of malignant neoplasm of large intestine   . Special screening for malignant neoplasms, colon   . Thyroid disease    Past Surgical History:  Procedure Laterality Date  . ABDOMINAL HYSTERECTOMY  1983  . COLON SURGERY  1990   resection d/t cancer  . COLONOSCOPY  1997,2001,2004,2006, 2011  . COLONOSCOPY WITH PROPOFOL N/A 10/24/2017   Procedure: COLONOSCOPY WITH PROPOFOL;  Surgeon: Christene Lye, MD;  Location: [facility] ENDOSCOPY;  Service: Endoscopy;  Laterality: N/A;  . EYE SURGERY  Sept 2013  . 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 SALPINGOOPHORECTOMY     Social History   Socioeconomic History  . Marital status: Married    Spouse name: Not on file  . Number of children: Not on file  . Years of education: Not on file  . Highest education level: High school graduate  Occupational History  . Not on file  Tobacco Use  . Smoking status: Never Smoker  . Smokeless tobacco: Never Used  Substance and Sexual Activity  . Alcohol use: No  Alcohol/week: 0.0 standard drinks  . Drug use: No  . Sexual activity: Not on file  Other Topics Concern  . Not on file  Social History Narrative   Gym one to 2 days  Week    Social Determinants of Health   Financial Resource Strain:   . Difficulty of Paying Living Expenses:   Food Insecurity:   . Worried About Charity fundraiser in the Last Year:   . Arboriculturist in the Last Year:   Transportation Needs:   . Film/video editor (Medical):   Marland Kitchen Lack of Transportation (Non-Medical):   Physical Activity:     . Days of Exercise per Week:   . Minutes of Exercise per Session:   Stress:   . Feeling of Stress :   Social Connections:   . Frequency of Communication with Friends and Family:   . Frequency of Social Gatherings with Friends and Family:   . Attends Religious Services:   . Active Member of Clubs or Organizations:   . Attends Archivist Meetings:   Marland Kitchen Marital Status:   Intimate Partner Violence:   . Fear of Current or Ex-Partner:   . Emotionally Abused:   Marland Kitchen Physically Abused:   . Sexually Abused:    Family History  Problem Relation Age of Onset  . Hypertension Mother   . Hypertension Father   . Diabetes Father   . Cancer Other        FH of breast and colon cancer  . Cancer Brother        colon  . Cancer Sister        breast  . Breast cancer Sister 66   Current Outpatient Medications on File Prior to Visit  Medication Sig  . aspirin EC 81 MG tablet Take 1 tablet (81 mg total) by mouth daily.  . calcium carbonate (OS-CAL - DOSED IN MG OF ELEMENTAL CALCIUM) 1250 MG tablet Take 1 tablet by mouth. With vitamin d  . fluticasone (FLONASE) 50 MCG/ACT nasal spray Place into both nostrils daily.  . Glucosamine-Chondroitin (GLUCOSAMINE CHONDR COMPLEX) 500-400 MG CAPS Take 2 capsules by mouth daily.  Marland Kitchen levothyroxine (SYNTHROID) 50 MCG tablet Take 1 tablet (50 mcg total) by mouth daily.  Marland Kitchen lisinopril (ZESTRIL) 40 MG tablet TAKE ONE-HALF (1/2) TABLET  BY MOUTH TWICE A DAY  . Omega-3 Fatty Acids (FISH OIL) 1000 MG CAPS Take 1 capsule by mouth daily.   . Psyllium 0.36 g CAPS Take by mouth.   No current facility-administered medications on file prior to visit.    Review of Systems  Constitutional: Negative for activity change, appetite change, chills, diaphoresis, fatigue and fever.  HENT: Negative for congestion and hearing loss.   Eyes: Negative for visual disturbance.  Respiratory: Negative for cough, chest tightness, shortness of breath and wheezing.   Cardiovascular:  Negative for chest pain, palpitations and leg swelling.  Gastrointestinal: Negative for abdominal pain, constipation, diarrhea, nausea and vomiting.  Endocrine: Negative for cold intolerance.  Genitourinary: Negative for dysuria, frequency and hematuria.  Musculoskeletal: Negative for arthralgias and neck pain.  Skin: Negative for rash.  Allergic/Immunologic: Positive for environmental allergies.  Neurological: Negative for dizziness, weakness, light-headedness, numbness and headaches.  Hematological: Negative for adenopathy.  Psychiatric/Behavioral: Negative for behavioral problems, dysphoric mood and sleep disturbance.   Per HPI unless specifically indicated above      Objective:    BP 134/80 (BP Location: Left Arm, Cuff Size: Normal)   Pulse 89  Temp (!) 97.5 F (36.4 C) (Temporal)   Resp 16   Ht 5\' 6"  (1.676 m)   Wt 124 lb 9.6 oz (56.5 kg)   SpO2 100%   BMI 20.11 kg/m   Wt Readings from Last 3 Encounters:  04/16/20 124 lb 9.6 oz (56.5 kg)  08/05/19 124 lb (56.2 kg)  04/18/19 123 lb 6.4 oz (56 kg)    Physical Exam Vitals and nursing note reviewed.  Constitutional:      General: She is not in acute distress.    Appearance: She is well-developed. She is not diaphoretic.     Comments: Well-appearing thin 86 yr old female, comfortable, cooperative  HENT:     Head: Normocephalic and atraumatic.  Eyes:     General:        Right eye: No discharge.        Left eye: No discharge.     Conjunctiva/sclera: Conjunctivae normal.     Pupils: Pupils are equal, round, and reactive to light.  Neck:     Thyroid: No thyromegaly.  Cardiovascular:     Rate and Rhythm: Normal rate and regular rhythm.     Heart sounds: Normal heart sounds. No murmur.  Pulmonary:     Effort: Pulmonary effort is normal. No respiratory distress.     Breath sounds: Normal breath sounds. No wheezing or rales.  Abdominal:     General: Bowel sounds are normal. There is no distension.     Palpations:  Abdomen is soft. There is no mass.     Tenderness: There is no abdominal tenderness.  Musculoskeletal:        General: No tenderness. Normal range of motion.     Cervical back: Normal range of motion and neck supple.     Comments: Upper / Lower Extremities: - Normal muscle tone, strength bilateral upper extremities 5/5, lower extremities 5/5  Lymphadenopathy:     Cervical: No cervical adenopathy.  Skin:    General: Skin is warm and dry.     Findings: No erythema or rash.  Neurological:     Mental Status: She is alert and oriented to person, place, and time.     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     Comments: Well groomed, good eye contact, normal speech and thoughts    Results for orders placed or performed in visit on 04/16/20  POCT urinalysis dipstick  Result Value Ref Range   Color, UA amber    Clarity, UA clear    Glucose, UA Negative Negative   Bilirubin, UA Negative    Ketones, UA Negative    Spec Grav, UA 1.010 1.010 - 1.025   Blood, UA Negative    pH, UA 5.0 5.0 - 8.0   Protein, UA Negative Negative   Urobilinogen, UA 0.2 0.2 or 1.0 E.U./dL   Nitrite, UA Negative    Leukocytes, UA Negative Negative   Appearance     Odor        Assessment & Plan:   Problem List Items Addressed This Visit    White coat syndrome with diagnosis of hypertension   Rheumatoid arthritis involving both hands (HCC)    Stable chronic problem Joint hand pain with episodic swelling and pain Taking NSAID PRN      Hypothyroidism (Chronic)    Asymptomatic Stable, controlled  Last lab TSH normal. Continue Levothyroxine 58mcg daily      Family history of breast cancer    Check mammogram      Relevant Orders   MM DIGITAL SCREENING BILATERAL   Essential hypertension    Elevated initial BP, repeat manual improved Multiple  occasions her home blood pressure cuff has been calibrated Home readings available today 120-130s appropriate Seems to be most consistent with white coat HTN No other known complication. History of hypotension / syncope Failed Amlodipine (swelling). Discontinued Triamterene-HCTZ in past.  Plan:  1. Continue current med Lisinopril 20mg  BID 2. Encourage improved lifestyle - continue low sodium diet, regular exercise 3. Continue close monitor BP outside office weekly, bring readings 4. Follow-up yearly      Elevated hemoglobin A1c    Well-controlled Pre-DM with A1c 5.3, stable, under range of PreDM Concern with HTN  Plan:  1. Not on any therapy currently  2. Encourage improved lifestyle - low carb, low sugar diet - given BMI 20 and prior wt loss, encouraged to continue higher protein diet, should not limit calories based on concern for Pre-DM. Continue regular exercise  Check yearly       Other Visit Diagnoses    Annual physical exam    -  Primary   Seasonal allergic rhinitis due to other allergic trigger       Relevant Medications   ipratropium (ATROVENT) 0.06 % nasal spray   Encounter for screening mammogram for malignant neoplasm of breast       Relevant Orders   MM DIGITAL SCREENING BILATERAL   Urinary urgency       Relevant Orders   POCT urinalysis dipstick (Completed)      Updated Health Maintenance information - Ordered Mammogram screening, patient to schedule Reviewed recent lab results with patient - Check UA today due to some urinary urgency but otherwise asymptomatic - it is negative, not suggestive of UTI Encouraged improvement to lifestyle with diet and exercise Maintain weight  #Constipation Stable chronic episodic problem Reviewed therapy options, dietary change inc fiber, improve hydration Add OTC Metamucil fiber capsules, miralax per AVS  Meds ordered this encounter  Medications  . ipratropium (ATROVENT) 0.06 % nasal spray    Sig: Place 2 sprays  into both nostrils 4 (four) times daily as needed for rhinitis. For allergies and congestion as needed    Dispense:  15 mL    Refill:  5     Follow up plan: Return in about 1 year (around 04/16/2021) for Annual Physical.  [name], DO [facility] 04/16/2020, 2:16 PM

## 2020-04-16 NOTE — Patient Instructions (Addendum)
Thank you for coming to the office today.  Added urine test today, stay tuned for results.  ------------------------------------------------------  For Mammogram screening for breast cancer   Call the [place] below anytime to schedule your own appointment now that order has been placed.  [facility] [place], [place] [address] Phone: [phone]  ---------------------------------------------  Leg cramps - Try spoonful of yellow mustard to relieve leg cramps or try daily to prevent the problem  - OTC natural option is Hyland's Leg Cramps (Dissolving tablet) take as needed for muscle cramps  --------  Metamucil Capsules Fiber supplement - take 1 with each meal, can increase as needed.  For Constipation (less frequent bowel movement that can be hard dry or involve straining).  Recommend trying OTC Miralax 17g = 1 capful in large glass water once daily for now, try several days to see if working, goal is soft stool or BM 1-2 times daily, if too loose then reduce dose or try every other day. If not effective may need to increase it to 2 doses at once in AM or may do 1 in morning and 1 in afternoon/evening  - This medicine is very safe and can be used often without any problem and will not make you dehydrated. It is good for use on AS NEEDED BASIS or even MAINTENANCE therapy for longer term for several days to weeks at a time to help regulate bowel movements  Other more natural remedies or preventative treatment: - Increase hydration with water - Increase fiber in diet (high fiber foods = vegetables, leafy greens, oats/grains) - as mentioned above Metamucil Capsule.  DUE for FASTING BLOOD WORK (no food or drink after midnight before the lab appointment, only water or coffee without cream/sugar on the morning of)  SCHEDULE "Lab Only" visit in the morning at the clinic for lab draw in 1 YEAR  - Make sure Lab  Only appointment is at about 1 week before your next appointment, so that results will be available  For Lab Results, once available within 2-3 days of blood draw, you can can log in to MyChart online to view your results and a brief explanation. Also, we can discuss results at next follow-up visit.   Please schedule a Follow-up Appointment to: Return in about 1 year (around 04/16/2021) for Annual Physical.  If you have any other questions or concerns, please feel free to call the office or send a message through [place]. You may also schedule an earlier appointment if necessary.  Additionally, you may be receiving a survey about your experience at our office within a few days to 1 week by e-mail or mail. We value your feedback.  [name], DO [place]

## 2020-04-18 NOTE — Assessment & Plan Note (Signed)
Elevated initial BP, repeat manual improved Multiple occasions her home blood pressure cuff has been calibrated Home readings available today 120-130s appropriate Seems to be most consistent with white coat HTN No other known complication. History of hypotension / syncope Failed Amlodipine (swelling). Discontinued Triamterene-HCTZ in past.  Plan:  1. Continue current med Lisinopril 20mg  BID 2. Encourage improved lifestyle - continue low sodium diet, regular exercise 3. Continue close monitor BP outside office weekly, bring readings 4. Follow-up yearly

## 2020-04-18 NOTE — Assessment & Plan Note (Signed)
Asymptomatic Stable, controlled Last lab TSH normal. Continue Levothyroxine 68mg daily

## 2020-04-18 NOTE — Assessment & Plan Note (Signed)
Check mammogram 

## 2020-04-18 NOTE — Assessment & Plan Note (Signed)
Stable chronic problem Joint hand pain with episodic swelling and pain Taking NSAID PRN

## 2020-04-18 NOTE — Assessment & Plan Note (Signed)
Well-controlled Pre-DM with A1c 5.3, stable, under range of PreDM Concern with HTN  Plan:  1. Not on any therapy currently  2. Encourage improved lifestyle - low carb, low sugar diet - given BMI 20 and prior wt loss, encouraged to continue higher protein diet, should not limit calories based on concern for Pre-DM. Continue regular exercise  Check yearly

## 2020-06-15 ENCOUNTER — Telehealth: Payer: Self-pay

## 2020-06-15 NOTE — Telephone Encounter (Signed)
Called patient regarding Dr Marthann Schiller reply for her message, unable to reach the patient, please relay information. Thank you.

## 2020-06-15 NOTE — Telephone Encounter (Signed)
Copied from [place] [id number]. Topic: General - Other >> Jun 15, 2020  9:53 AM [name] wrote: Reason for CRM: Patient called to ask the doctor if he can recommend an alternative med. To the Oscal that she said she has been taking for years.  She stated that pharmacy said they do not have it and do not know when they will get it again.  Please advise and call patient to discuss at [phone]

## 2020-06-15 NOTE — Telephone Encounter (Signed)
Pt. Given message and verbalizes understanding. 

## 2020-06-15 NOTE — Telephone Encounter (Signed)
Oscal is calcium + vitamin D brand name supplement.  These are OTC options. Not rx.  She can take Calcium Carbonate (TUMS) 500mg  tablet take two for 1000mg  once every day with a meal.  Also she can add a Vitamin D3 1,000 or 2,000 unit every day supplement.  [name], [facility] 06/15/2020, 12:56 PM

## 2020-08-17 ENCOUNTER — Other Ambulatory Visit: Payer: Self-pay | Admitting: Family Medicine

## 2020-08-17 DIAGNOSIS — E039 Hypothyroidism, unspecified: Secondary | ICD-10-CM

## 2020-08-17 MED ORDER — LEVOTHYROXINE SODIUM 50 MCG PO TABS: 50.0000 ug | ORAL_TABLET | Freq: Every day | ORAL | 3 refills | Status: DC

## 2020-08-19 ENCOUNTER — Other Ambulatory Visit: Payer: Self-pay

## 2020-08-19 ENCOUNTER — Ambulatory Visit (INDEPENDENT_AMBULATORY_CARE_PROVIDER_SITE_OTHER): Payer: Medicare Other

## 2020-08-19 DIAGNOSIS — Z23 Encounter for immunization: Secondary | ICD-10-CM

## 2020-11-04 ENCOUNTER — Ambulatory Visit: Payer: Medicare Other | Admitting: Family Medicine

## 2020-11-04 ENCOUNTER — Other Ambulatory Visit: Payer: Self-pay

## 2020-11-04 ENCOUNTER — Encounter: Payer: Self-pay | Admitting: Family Medicine

## 2020-11-04 VITALS — BP 154/80 | HR 82 | Ht 66.0 in | Wt 123.0 lb

## 2020-11-04 DIAGNOSIS — K56609 Unspecified intestinal obstruction, unspecified as to partial versus complete obstruction: Secondary | ICD-10-CM | POA: Diagnosis not present

## 2020-11-04 NOTE — Patient Instructions (Addendum)
Thank you for coming to the office today.  For Constipation (less frequent bowel movement that can be hard dry or involve straining).  Recommend trying OTC Miralax 17g = HALF or 1 capful in large glass water once daily for now, try several days to see if working, goal is soft stool or BM 1-2 times daily, if too loose then reduce dose or try every other day. If not effective may need to increase it to 2 doses at once in AM or may do 1 in morning and 1 in afternoon/evening  - This medicine is very safe and can be used often without any problem and will not make you dehydrated. It is good for use on AS NEEDED BASIS or even MAINTENANCE therapy for longer term for several days to weeks at a time to help regulate bowel movements  Other more natural remedies or preventative treatment: - Increase hydration with water - Increase fiber in diet (high fiber foods = vegetables, leafy greens, oats/grains) - May take OTC Fiber supplement (metamucil powder or pill/gummy) - May try OTC Probiotic   Wait 2-4 weeks before any heavy lifting >20 lbs.   Please schedule a Follow-up Appointment to: Return if symptoms worsen or fail to improve, for keep upcoming apt.  If you have any other questions or concerns, please feel free to call the office or send a message through [place]. You may also schedule an earlier appointment if necessary.  Additionally, you may be receiving a survey about your experience at our office within a few days to 1 week by e-mail or mail. We value your feedback.  [name], DO [place]

## 2020-11-04 NOTE — Progress Notes (Signed)
Subjective:    Patient ID: Brittany Oneal, female    DOB: 07/25/1931, 86 y.o.   MRN: [id number]  Brittany Oneal is a 86 y.o. female presenting on 11/04/2020 for post surgery follow up  (Says she is getting better and feeling stronger every day.) and small bowel obstruction   HPI  HOSPITAL FOLLOW-UP VISIT  Hospital/Location: [facility] Date of Admission: 10/23/20 Date of Discharge: 10/26/20 Transitions of care telephone call: not completed  Reason for Admission: acute abdominal pain  - Hospital H&P and Discharge Summary have been reviewed - Patient presents today 9 days after recent hospitalization. Brief summary of recent course, patient had symptoms of acute severe abdominal pain and nausea, hospitalized, treated with NPO and gastric suction with thought to have SBO that was triggered by abdominal adhesion and scar tissue (prior colon cancer surgery and hysterectomy in past), they did a Lap Surgery and clipped some scar tissue areas.  - Today reports overall has done well after discharge. Symptoms of abdominal pain has nearly resolved, and nausea has resolved post op. - Appetite is improved. PO intake is good. - Admits some gas increased - Some bowel movements but not regular.  - New medications on discharge: None - Changes to current meds on discharge: none  I have reviewed the discharge medication list, and have reconciled the current and discharge medications today.  Past Surgical History:  Procedure Laterality Date  . ABDOMINAL HYSTERECTOMY  1983  . COLON SURGERY  1990   resection d/t cancer  . COLONOSCOPY  1997,2001,2004,2006, 2011  . COLONOSCOPY WITH PROPOFOL N/A 10/24/2017   Procedure: COLONOSCOPY WITH PROPOFOL;  Surgeon: Christene Lye, MD;  Location: [facility] ENDOSCOPY;  Service: Endoscopy;  Laterality: N/A;  . EYE SURGERY  Sept 2013  . 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 SALPINGOOPHORECTOMY        Current Outpatient Medications:   .  aspirin EC 81 MG tablet, Take 1 tablet (81 mg total) by mouth daily., Disp: , Rfl:  .  fluticasone (FLONASE) 50 MCG/ACT nasal spray, Place into both nostrils daily., Disp: , Rfl:  .  Glucosamine-Chondroitin 500-400 MG CAPS, Take 2 capsules by mouth daily., Disp: , Rfl:  .  ipratropium (ATROVENT) 0.06 % nasal spray, Place 2 sprays into both nostrils 4 (four) times daily as needed for rhinitis. For allergies and congestion as needed, Disp: 15 mL, Rfl: 5 .  levothyroxine (SYNTHROID) 50 MCG tablet, Take 1 tablet (50 mcg total) by mouth daily before breakfast., Disp: 90 tablet, Rfl: 3 .  lisinopril (ZESTRIL) 40 MG tablet, TAKE ONE-HALF (1/2) TABLET  BY MOUTH TWICE A DAY, Disp: 90 tablet, Rfl: 3 .  Omega-3 Fatty Acids (FISH OIL) 1000 MG CAPS, Take 1 capsule by mouth daily. , Disp: , Rfl:  .  Psyllium 0.36 g CAPS, Take by mouth., Disp: , Rfl:   ------------------------------------------------------------------------- Social History   Tobacco Use  . Smoking status: Never Smoker  . Smokeless tobacco: Never Used  Vaping Use  . Vaping Use: Never used  Substance Use Topics  . Alcohol use: No    Alcohol/week: 0.0 standard drinks  . Drug use: No    Review of Systems Per HPI unless specifically indicated above     Objective:    BP (!) 154/80 (BP Location: Left Arm, Patient Position: Sitting, Cuff Size: Normal)   Pulse 82   Ht 5\' 6"  (1.676 m)   Wt 123 lb (55.8 kg)  SpO2 100%   BMI 19.85 kg/m   Wt Readings from Last 3 Encounters:  11/04/20 123 lb (55.8 kg)  04/16/20 124 lb 9.6 oz (56.5 kg)  08/05/19 124 lb (56.2 kg)    Physical Exam Vitals and nursing note reviewed.  Constitutional:      General: She is not in acute distress.    Appearance: She is well-developed and well-nourished. She is not diaphoretic.     Comments: Well-appearing, comfortable, cooperative, thin  HENT:     Head: Normocephalic and atraumatic.     Mouth/Throat:     Mouth: Oropharynx is clear and moist.  Eyes:      General:        Right eye: No discharge.        Left eye: No discharge.     Conjunctiva/sclera: Conjunctivae normal.  Neck:     Thyroid: No thyromegaly.  Cardiovascular:     Rate and Rhythm: Normal rate and regular rhythm.     Pulses: Intact distal pulses.     Heart sounds: Normal heart sounds. No murmur heard.   Pulmonary:     Effort: Pulmonary effort is normal. No respiratory distress.     Breath sounds: Normal breath sounds. No wheezing or rales.  Abdominal:     General: Bowel sounds are normal. There is no distension.     Palpations: Abdomen is soft. There is no mass.     Tenderness: There is no abdominal tenderness.     Hernia: No hernia is present.  Musculoskeletal:        General: No edema. Normal range of motion.     Cervical back: Normal range of motion and neck supple.  Lymphadenopathy:     Cervical: No cervical adenopathy.  Skin:    General: Skin is warm and dry.     Findings: No erythema or rash.     Comments: 3  X approx 2 cm linear incision sites with scabs appear to be healing very well, no erythema, no open wound no drainage non tender  Neurological:     Mental Status: She is alert and oriented to person, place, and time.  Psychiatric:        Mood and Affect: Mood and affect normal.        Behavior: Behavior normal.     Comments: Well groomed, good eye contact, normal speech and thoughts    Results for orders placed or performed in visit on 04/16/20  POCT urinalysis dipstick  Result Value Ref Range   Color, UA amber    Clarity, UA clear    Glucose, UA Negative Negative   Bilirubin, UA Negative    Ketones, UA Negative    Spec Grav, UA 1.010 1.010 - 1.025   Blood, UA Negative    pH, UA 5.0 5.0 - 8.0   Protein, UA Negative Negative   Urobilinogen, UA 0.2 0.2 or 1.0 E.U./dL   Nitrite, UA Negative    Leukocytes, UA Negative Negative   Appearance     Odor        Assessment & Plan:   Problem List Items Addressed This Visit    RESOLVED: SBO  (small bowel obstruction) (HCC) - Primary    Resolved. Asymptomatic S/p NPO gastric suction in hospital and lap abdomen surgery with adhesion removal to resolve SBO. Now tolerating regular PO and improving. Improved bowel habits BMs, gas          No orders of the defined types were placed in this encounter.  Follow up plan: Return if symptoms worsen or fail to improve, for keep upcoming apt.   [name], DO [facility] 11/04/2020, 11:22 AM

## 2020-11-04 NOTE — Assessment & Plan Note (Signed)
Resolved. Asymptomatic S/p NPO gastric suction in hospital and lap abdomen surgery with adhesion removal to resolve SBO. Now tolerating regular PO and improving. Improved bowel habits BMs, gas

## 2020-11-05 ENCOUNTER — Ambulatory Visit
Admission: RE | Admit: 2020-11-05 | Discharge: 2020-11-05 | Disposition: A | Discharge status: Home / Self Care | Payer: Medicare Other | Source: Ambulatory Visit | Attending: Family Medicine | Admitting: Family Medicine

## 2020-11-05 DIAGNOSIS — Z803 Family history of malignant neoplasm of breast: Secondary | ICD-10-CM | POA: Insufficient documentation

## 2020-11-05 DIAGNOSIS — Z1231 Encounter for screening mammogram for malignant neoplasm of breast: Secondary | ICD-10-CM | POA: Insufficient documentation

## 2020-12-21 LAB — FECAL OCCULT BLOOD, GUAIAC: Fecal Occult Blood: NEGATIVE

## 2021-01-13 ENCOUNTER — Telehealth: Payer: Self-pay | Admitting: Family Medicine

## 2021-01-13 ENCOUNTER — Encounter: Payer: Self-pay | Admitting: Family Medicine

## 2021-01-13 NOTE — Telephone Encounter (Signed)
I called the patient to notify her results from a Fecal Occult Blood Test and left a message to call back to the office.

## 2021-01-13 NOTE — Telephone Encounter (Signed)
Attempt to reach the office staff no answer. Pt is calling back for her test results. Please advise CB- [phone]

## 2021-01-14 NOTE — Telephone Encounter (Signed)
The pt was notified of her negative fecal occult test result and the recommendation to repeat in 1 yr. She verbalize understanding, no questions or concerns.

## 2021-02-25 ENCOUNTER — Telehealth: Payer: Self-pay | Admitting: Family Medicine

## 2021-02-25 NOTE — Telephone Encounter (Signed)
Copied from [place] [id number]. Topic: Medicare AWV >> Feb 25, 2021  1:38 PM Cher Nakai R wrote: Reason for CRM:  Left message for patient to call back and schedule the Medicare Annual Wellness Visit (AWV) virtually or by telephone.  Last AWV 08/05/2019  Please schedule at anytime with [place].  40 minute appointment  Any questions, please call me at [phone]

## 2021-03-04 ENCOUNTER — Other Ambulatory Visit: Payer: Self-pay | Admitting: Family Medicine

## 2021-03-04 DIAGNOSIS — I10 Essential (primary) hypertension: Secondary | ICD-10-CM

## 2021-03-04 NOTE — Telephone Encounter (Signed)
Requested Prescriptions  Pending Prescriptions Disp Refills  . lisinopril (ZESTRIL) 40 MG tablet [Pharmacy Med Name: Lisinopril 40 MG Oral Tablet] 90 tablet 0    Sig: TAKE ONE-HALF TABLET BY  MOUTH TWICE A DAY     Cardiovascular:  ACE Inhibitors Failed - 03/04/2021 11:07 PM      Failed - Cr in normal range and within 180 days    Creat  Date Value Ref Range Status  04/12/2020 0.77 0.60 - 0.88 mg/dL Final    Comment:    For patients >85 years of age, the reference limit for Creatinine is approximately 13% higher for people identified as African-American. .          Failed - K in normal range and within 180 days    Potassium  Date Value Ref Range Status  04/12/2020 4.4 3.5 - 5.3 mmol/L Final  03/09/2015 4.5 mmol/L Final    Comment:    3.5-5.1 NOTE: New Reference Range  02/02/15          Failed - Last BP in normal range    BP Readings from Last 1 Encounters:  11/04/20 (!) 154/80         Passed - Patient is not pregnant      Passed - Valid encounter within last 6 months    Recent Outpatient Visits          4 months ago SBO (small bowel obstruction) ([place])   [facility], Devonne Doughty, DO   10 months ago Annual physical exam   [facility] Olin Hauser, DO   1 year ago Annual physical exam   [facility] Olin Hauser, DO   2 years ago Essential hypertension   [facility] Olin Hauser, DO   2 years ago Essential hypertension   [place], DO      Future Appointments            In 1 month [facility], Devonne Doughty, [facility], [facility]

## 2021-04-07 ENCOUNTER — Telehealth: Payer: Self-pay

## 2021-04-07 DIAGNOSIS — I10 Essential (primary) hypertension: Secondary | ICD-10-CM

## 2021-04-07 DIAGNOSIS — R7309 Other abnormal glucose: Secondary | ICD-10-CM

## 2021-04-07 DIAGNOSIS — E7800 Pure hypercholesterolemia, unspecified: Secondary | ICD-10-CM

## 2021-04-07 DIAGNOSIS — Z0000 Encounter for general adult medical examination without abnormal findings: Secondary | ICD-10-CM

## 2021-04-07 DIAGNOSIS — E039 Hypothyroidism, unspecified: Secondary | ICD-10-CM

## 2021-04-07 NOTE — Telephone Encounter (Signed)
Placed fasting lab orders for 5/25 approximately 1 week prior to 6/1  [name], [facility] 04/07/2021, 4:27 PM

## 2021-04-07 NOTE — Telephone Encounter (Signed)
Copied from [place] [phone]. Topic: Appointment Scheduling - Scheduling Inquiry for Clinic >> Apr 07, 2021 11:08 AM Lennox Solders wrote: Reason for CRM: Pt is calling and per pt is sch for cpe labs on 04-16-2021 that was written on her AVS . I do not see orders. Pt is aware of her cpe on 04-27-2021. Please advise

## 2021-04-08 NOTE — Telephone Encounter (Signed)
Called pt spoke to her husband. Let him know that labs was placed for 5/25. Also let him know that pt should be fasting. Pts husband said he would tell his wife.  KP

## 2021-04-20 ENCOUNTER — Other Ambulatory Visit: Payer: Self-pay | Admitting: *Deleted

## 2021-04-20 DIAGNOSIS — E7800 Pure hypercholesterolemia, unspecified: Secondary | ICD-10-CM

## 2021-04-20 DIAGNOSIS — R7309 Other abnormal glucose: Secondary | ICD-10-CM

## 2021-04-20 DIAGNOSIS — Z0000 Encounter for general adult medical examination without abnormal findings: Secondary | ICD-10-CM

## 2021-04-20 DIAGNOSIS — E039 Hypothyroidism, unspecified: Secondary | ICD-10-CM

## 2021-04-20 DIAGNOSIS — I10 Essential (primary) hypertension: Secondary | ICD-10-CM

## 2021-04-21 ENCOUNTER — Encounter: Payer: Medicare Other | Admitting: Family Medicine

## 2021-04-21 LAB — CBC WITH DIFFERENTIAL/PLATELET
Absolute Monocytes: 547 cells/uL (ref 200–950)
Basophils Absolute: 48 cells/uL (ref 0–200)
Basophils Relative: 1 %
Eosinophils Absolute: 158 cells/uL (ref 15–500)
Eosinophils Relative: 3.3 %
HCT: 39.8 % (ref 35.0–45.0)
Hemoglobin: 13.2 g/dL (ref 11.7–15.5)
Lymphs Abs: 1637 cells/uL (ref 850–3900)
MCH: 28.8 pg (ref 27.0–33.0)
MCHC: 33.2 g/dL (ref 32.0–36.0)
MCV: 86.7 fL (ref 80.0–100.0)
MPV: 10.5 fL (ref 7.5–12.5)
Monocytes Relative: 11.4 %
Neutro Abs: 2410 cells/uL (ref 1500–7800)
Neutrophils Relative %: 50.2 %
Platelets: 184 10*3/uL (ref 140–400)
RBC: 4.59 10*6/uL (ref 3.80–5.10)
RDW: 13 % (ref 11.0–15.0)
Total Lymphocyte: 34.1 %
WBC: 4.8 10*3/uL (ref 3.8–10.8)

## 2021-04-21 LAB — COMPLETE METABOLIC PANEL WITH GFR
AG Ratio: 2 (calc) (ref 1.0–2.5)
ALT: 10 U/L (ref 6–29)
AST: 19 U/L (ref 10–35)
Albumin: 4.5 g/dL (ref 3.6–5.1)
Alkaline phosphatase (APISO): 52 U/L (ref 37–153)
BUN: 19 mg/dL (ref 7–25)
CO2: 30 mmol/L (ref 20–32)
Calcium: 9.3 mg/dL (ref 8.6–10.4)
Chloride: 98 mmol/L (ref 98–110)
Creat: 0.74 mg/dL (ref 0.60–0.88)
GFR, Est African American: 84 mL/min/{1.73_m2} (ref >=60)
GFR, Est Non African American: 73 mL/min/{1.73_m2} (ref >=60)
Globulin: 2.2 g/dL (calc) (ref 1.9–3.7)
Glucose, Bld: 84 mg/dL (ref 65–99)
Potassium: 4.2 mmol/L (ref 3.5–5.3)
Sodium: 134 mmol/L — ABNORMAL LOW (ref 135–146)
Total Bilirubin: 0.7 mg/dL (ref 0.2–1.2)
Total Protein: 6.7 g/dL (ref 6.1–8.1)

## 2021-04-21 LAB — HEMOGLOBIN A1C
Hgb A1c MFr Bld: 5.4 % of total Hgb (ref <5.7)
Mean Plasma Glucose: 108 mg/dL
eAG (mmol/L): 6 mmol/L

## 2021-04-21 LAB — T4, FREE: Free T4: 1.3 ng/dL (ref 0.8–1.8)

## 2021-04-21 LAB — LIPID PANEL
Cholesterol: 186 mg/dL (ref <200)
HDL: 78 mg/dL (ref >=50)
LDL Cholesterol (Calc): 94 mg/dL (calc)
Non-HDL Cholesterol (Calc): 108 mg/dL (calc) (ref <130)
Total CHOL/HDL Ratio: 2.4 (calc) (ref <5.0)
Triglycerides: 54 mg/dL (ref <150)

## 2021-04-21 LAB — TSH: TSH: 3.16 mIU/L (ref 0.40–4.50)

## 2021-04-27 ENCOUNTER — Encounter: Payer: Self-pay | Admitting: Family Medicine

## 2021-04-27 ENCOUNTER — Other Ambulatory Visit: Payer: Self-pay

## 2021-04-27 ENCOUNTER — Ambulatory Visit (INDEPENDENT_AMBULATORY_CARE_PROVIDER_SITE_OTHER): Payer: Medicare Other | Admitting: Family Medicine

## 2021-04-27 VITALS — BP 166/81 | HR 78 | Ht 66.0 in | Wt 120.4 lb

## 2021-04-27 DIAGNOSIS — Z0000 Encounter for general adult medical examination without abnormal findings: Secondary | ICD-10-CM

## 2021-04-27 DIAGNOSIS — E039 Hypothyroidism, unspecified: Secondary | ICD-10-CM | POA: Diagnosis not present

## 2021-04-27 DIAGNOSIS — Z23 Encounter for immunization: Secondary | ICD-10-CM

## 2021-04-27 DIAGNOSIS — I872 Venous insufficiency (chronic) (peripheral): Secondary | ICD-10-CM

## 2021-04-27 DIAGNOSIS — I10 Essential (primary) hypertension: Secondary | ICD-10-CM

## 2021-04-27 DIAGNOSIS — R7309 Other abnormal glucose: Secondary | ICD-10-CM

## 2021-04-27 DIAGNOSIS — E7800 Pure hypercholesterolemia, unspecified: Secondary | ICD-10-CM

## 2021-04-27 DIAGNOSIS — Z1231 Encounter for screening mammogram for malignant neoplasm of breast: Secondary | ICD-10-CM

## 2021-04-27 MED ORDER — SHINGRIX 50 MCG/0.5ML IM SUSR: INTRAMUSCULAR | 0 refills | Status: DC

## 2021-04-27 MED ORDER — LEVOTHYROXINE SODIUM 50 MCG PO TABS: 50.0000 ug | ORAL_TABLET | Freq: Every day | ORAL | 3 refills | Status: DC

## 2021-04-27 MED ORDER — LISINOPRIL 40 MG PO TABS: 20.0000 mg | ORAL_TABLET | Freq: Two times a day (BID) | ORAL | 3 refills | Status: DC

## 2021-04-27 NOTE — Assessment & Plan Note (Signed)
Asymptomatic Stable, controlled Last lab TSH normal. Continue Levothyroxine 68mg daily

## 2021-04-27 NOTE — Progress Notes (Signed)
Subjective:    Patient ID: Brittany Oneal, female    DOB: 1931-02-16, 87 y.o.   MRN: [id number]  Brittany Oneal is a 87 y.o. female presenting on 04/27/2021 for Annual Exam and Hypertension   HPI    Here for Annual Physical and Lab Review.  CHRONIC HTN: Reports has some elevated readings in office but home BP readings are normal range 120-130s Lab showed normal creatinine Current Meds - Lisinopril20mg  BID (half of 40mg ) Reports good compliance, took meds today. Tolerating well, w/o complaints - Taking ASA 81mg  daily tolerating well  Hypothyroidism, chronic Last lab normal TSH Stable chronic problem, still taking Levothyroxine 46mcg daily She denies any symptoms of low thyroid hormone at this time  Elevated A1c Last lab improved A1c 5.4 Meds:Never on medication for blood sugar Currently on ACEi Lifestyle: - Weight isstable - Diet (Still focusing on healthy diet, avoiding sweets, improving appetite,drinks mostly water, some tea, coffee)  - Exercise (Regular exercisesmostly outdoor and at home) - Family history of DM2 (Father) Denies hypoglycemia  Mild Elevated LDL - Reports no concerns. Last lipidpanel 03/2021 normal -Currently takingFish Oil omega 3 1g daily - No known history ofCAD, MI, CVA  - She has been taking ASA 81mg  daily since last visit, doing well without bleeding or other episodes - She has never been on Statin medicine, has declined this in the past   Seasonal Allergies  Rheumatoid Arthritis Bilateral hands. Chronic problem. No new worsening or flare or joint pain.\   Health Maintenance: UTD COVID19 vaccine up to date., PNA vaccine UTD  Due shingrix dose #2.  Fam history of breast cancer - sisters and aunts. Next mammogram due 10/2021, prior 10/2020 negative.  Depression screen [facility] 2/9 11/04/2020 04/16/2020 08/05/2019  Decreased Interest 0 0 0  Down, Depressed, Hopeless 0 0 0  PHQ - 2 Score 0 0 0    Past Medical History:  Diagnosis  Date  . Arthritis   . Family history of malignant neoplasm of breast   . Family history of malignant neoplasm of gastrointestinal tract   . History of colon cancer   . Personal history of malignant neoplasm of large intestine   . Special screening for malignant neoplasms, colon   . Thyroid disease    Past Surgical History:  Procedure Laterality Date  . ABDOMINAL HYSTERECTOMY  1983  . COLON SURGERY  1990   resection d/t cancer  . COLONOSCOPY  1997,2001,2004,2006, 2011  . COLONOSCOPY WITH PROPOFOL N/A 10/24/2017   Procedure: COLONOSCOPY WITH PROPOFOL;  Surgeon: Christene Lye, MD;  Location: [facility] ENDOSCOPY;  Service: Endoscopy;  Laterality: N/A;  . EYE SURGERY  Sept 2013  . 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 SALPINGOOPHORECTOMY     Social History   Socioeconomic History  . Marital status: Married    Spouse name: Not on file  . Number of children: Not on file  . Years of education: Not on file  . Highest education level: High school graduate  Occupational History  . Not on file  Tobacco Use  . Smoking status: Never Smoker  . Smokeless tobacco: Never Used  Vaping Use  . Vaping Use: Never used  Substance and Sexual Activity  . Alcohol use: No    Alcohol/week: 0.0 standard drinks  . Drug use: No  . Sexual activity: Not on file  Other Topics Concern  . Not on file  Social History Narrative   Gym one to 2 days  Week    Social Determinants of Health   Financial Resource Strain: Not on file  Food Insecurity: Not on file  Transportation Needs: Not on file  Physical Activity: Not on file  Stress: Not on file  Social Connections: Not on file  Intimate Partner Violence: Not on file   Family History  Problem Relation Age of Onset  . Hypertension Mother   . Hypertension Father   . Diabetes Father   . Cancer Other        FH of breast and colon cancer  . Cancer Brother        colon  . Cancer Sister        breast  . Breast cancer Sister 51    Current Outpatient Medications on File Prior to Visit  Medication Sig  . aspirin EC 81 MG tablet Take 1 tablet (81 mg total) by mouth daily.  . fluticasone (FLONASE) 50 MCG/ACT nasal spray Place into both nostrils daily.  . Glucosamine-Chondroitin 500-400 MG CAPS Take 2 capsules by mouth daily.  Marland Kitchen ipratropium (ATROVENT) 0.06 % nasal spray Place 2 sprays into both nostrils 4 (four) times daily as needed for rhinitis. For allergies and congestion as needed  . Omega-3 Fatty Acids (FISH OIL) 1000 MG CAPS Take 1 capsule by mouth daily.   . Psyllium 0.36 g CAPS Take by mouth.   No current facility-administered medications on file prior to visit.    Review of Systems  Constitutional: Negative for activity change, appetite change, chills, diaphoresis, fatigue and fever.  HENT: Negative for congestion and hearing loss.   Eyes: Negative for visual disturbance.  Respiratory: Negative for cough, chest tightness, shortness of breath and wheezing.   Cardiovascular: Negative for chest pain, palpitations and leg swelling.  Gastrointestinal: Negative for abdominal pain, constipation, diarrhea, nausea and vomiting.  Endocrine: Negative for cold intolerance.  Genitourinary: Negative for dysuria, frequency and hematuria.  Musculoskeletal: Negative for arthralgias and neck pain.  Skin: Negative for rash.  Allergic/Immunologic: Negative for environmental allergies.  Neurological: Negative for dizziness, weakness, light-headedness, numbness and headaches.  Hematological: Negative for adenopathy.  Psychiatric/Behavioral: Negative for behavioral problems, dysphoric mood and sleep disturbance.   Per HPI unless specifically indicated above      Objective:    BP (!) 166/81   Pulse 78   Ht 5\' 6"  (1.676 m)   Wt 120 lb 6.4 oz (54.6 kg)   SpO2 99%   BMI 19.43 kg/m   Wt Readings from Last 3 Encounters:  04/27/21 120 lb 6.4 oz (54.6 kg)  11/04/20 123 lb (55.8 kg)  04/16/20 124 lb 9.6 oz (56.5 kg)     Physical Exam Vitals and nursing note reviewed.  Constitutional:      General: She is not in acute distress.    Appearance: She is well-developed. She is not diaphoretic.     Comments: Well-appearing, comfortable, cooperative  HENT:     Head: Normocephalic and atraumatic.  Eyes:     General:        Right eye: No discharge.        Left eye: No discharge.     Conjunctiva/sclera: Conjunctivae normal.     Pupils: Pupils are equal, round, and reactive to light.  Neck:     Thyroid: No thyromegaly.  Cardiovascular:     Rate and Rhythm: Normal rate and regular rhythm.     Heart sounds: Normal heart sounds. No murmur heard.   Pulmonary:     Effort: Pulmonary effort is normal.  No respiratory distress.     Breath sounds: Normal breath sounds. No wheezing or rales.  Abdominal:     General: Bowel sounds are normal. There is no distension.     Palpations: Abdomen is soft. There is no mass.     Tenderness: There is no abdominal tenderness.  Musculoskeletal:        General: No tenderness. Normal range of motion.     Cervical back: Normal range of motion and neck supple.     Right lower leg: No edema.     Left lower leg: No edema.     Comments: Upper / Lower Extremities: - Normal muscle tone, strength bilateral upper extremities 5/5, lower extremities 5/5  Lymphadenopathy:     Cervical: No cervical adenopathy.  Skin:    General: Skin is warm and dry.     Findings: No erythema or rash.     Comments: Lower extremity varicose veins  Neurological:     Mental Status: She is alert and oriented to person, place, and time.     Comments: Distal sensation intact to light touch all extremities  Psychiatric:        Behavior: Behavior normal.     Comments: Well groomed, good eye contact, normal speech and thoughts    Results for orders placed or performed in visit on 04/20/21  T4, free  Result Value Ref Range   Free T4 1.3 0.8 - 1.8 ng/dL  TSH  Result Value Ref Range   TSH 3.16 0.40 - 4.50  mIU/L  Lipid panel  Result Value Ref Range   Cholesterol 186 <200 mg/dL   HDL 78 > OR = 50 mg/dL   Triglycerides 54 <150 mg/dL   LDL Cholesterol (Calc) 94 mg/dL (calc)   Total CHOL/HDL Ratio 2.4 <5.0 (calc)   Non-HDL Cholesterol (Calc) 108 <130 mg/dL (calc)  COMPLETE METABOLIC PANEL WITH GFR  Result Value Ref Range   Glucose, Bld 84 65 - 99 mg/dL   BUN 19 7 - 25 mg/dL   Creat 0.74 0.60 - 0.88 mg/dL   GFR, Est Non African American 73 > OR = 60 mL/min/1.11m2   GFR, Est African American 84 > OR = 60 mL/min/1.75m2   BUN/Creatinine Ratio NOT APPLICABLE 6 - 22 (calc)   Sodium 134 (L) 135 - 146 mmol/L   Potassium 4.2 3.5 - 5.3 mmol/L   Chloride 98 98 - 110 mmol/L   CO2 30 20 - 32 mmol/L   Calcium 9.3 8.6 - 10.4 mg/dL   Total Protein 6.7 6.1 - 8.1 g/dL   Albumin 4.5 3.6 - 5.1 g/dL   Globulin 2.2 1.9 - 3.7 g/dL (calc)   AG Ratio 2.0 1.0 - 2.5 (calc)   Total Bilirubin 0.7 0.2 - 1.2 mg/dL   Alkaline phosphatase (APISO) 52 37 - 153 U/L   AST 19 10 - 35 U/L   ALT 10 6 - 29 U/L  CBC with Differential/Platelet  Result Value Ref Range   WBC 4.8 3.8 - 10.8 Thousand/uL   RBC 4.59 3.80 - 5.10 Million/uL   Hemoglobin 13.2 11.7 - 15.5 g/dL   HCT 39.8 35.0 - 45.0 %   MCV 86.7 80.0 - 100.0 fL   MCH 28.8 27.0 - 33.0 pg   MCHC 33.2 32.0 - 36.0 g/dL   RDW 13.0 11.0 - 15.0 %   Platelets 184 140 - 400 Thousand/uL   MPV 10.5 7.5 - 12.5 fL   Neutro Abs 2,410 1,500 - 7,800 cells/uL   Lymphs  Abs 1,637 850 - 3,900 cells/uL   Absolute Monocytes 547 200 - 950 cells/uL   Eosinophils Absolute 158 15 - 500 cells/uL   Basophils Absolute 48 0 - 200 cells/uL   Neutrophils Relative % 50.2 %   Total Lymphocyte 34.1 %   Monocytes Relative 11.4 %   Eosinophils Relative 3.3 %   Basophils Relative 1.0 %  Hemoglobin A1c  Result Value Ref Range   Hgb A1c MFr Bld 5.4 <5.7 % of total Hgb   Mean Plasma Glucose 108 mg/dL   eAG (mmol/L) 6.0 mmol/L   I have personally reviewed the radiology report from  Mammogram.  CLINICAL DATA:  Screening.  EXAM: DIGITAL SCREENING BILATERAL MAMMOGRAM WITH TOMO AND CAD  COMPARISON:  Previous exam(s).  ACR Breast Density Category b: There are scattered areas of fibroglandular density.  FINDINGS: There are no findings suspicious for malignancy. Images were processed with CAD.  IMPRESSION: No mammographic evidence of malignancy. A result letter of this screening mammogram will be mailed directly to the patient.  RECOMMENDATION: Screening mammogram in one year. (Code:SM-B-01Y)  BI-RADS CATEGORY  1: Negative.   Electronically Signed   By: Claudie Revering M.D.   On: 11/05/2020 12:43    Assessment & Plan:   Problem List Items Addressed This Visit    Hypothyroidism (Chronic)    Asymptomatic Stable, controlled Last lab TSH normal. Continue Levothyroxine 28mcg daily      Relevant Medications   levothyroxine (SYNTHROID) 50 MCG tablet   Essential hypertension    Elevated initial BP, repeat manual improved Multiple occasions her home blood pressure cuff has been calibrated Home readings available, unchanged from prior 120-130s appropriate range Stable most consistent with white coat HTN No other known complication. History of hypotension / syncope Failed Amlodipine (swelling). Discontinued Triamterene-HCTZ in past.  Plan:  1. Continue current med Lisinopril 20mg  BID (half of 40mg  tab per dose) 2. Encourage improved lifestyle - continue low sodium diet, regular exercise 3. Continue close monitor BP outside office weekly, bring readings 4. Follow-up yearly      Relevant Medications   lisinopril (ZESTRIL) 40 MG tablet   Elevated LDL cholesterol level    Controlled on lifestyle, fish oil Continue ASA Not on statin therapy, declines.      Elevated hemoglobin A1c    Other Visit Diagnoses    Annual physical exam    -  Primary   Encounter for screening mammogram for malignant neoplasm of breast       Relevant Orders   MM 3D  SCREEN BREAST BILATERAL   Need for shingles vaccine       Relevant Medications   SHINGRIX injection   Venous insufficiency of both lower extremities       Relevant Medications   lisinopril (ZESTRIL) 40 MG tablet      Updated Health Maintenance information Rx for Shingrix dose#2 printed Consider COVID19 booster Mammogram 3D ordered [facility] to be scheduled 10/2021 Reviewed recent lab results with patient Encouraged improvement to lifestyle with diet and exercise Maintain healthy weight  #Venous insufficiency History most suggestive of benign venous insufficiency episodic swelling lower extremities Counseling on RICE therapy  Meds ordered this encounter  Medications  . SHINGRIX injection    Sig: Inject 0.5 mL into muscle for shingles vaccine. 2nd dose.    Dispense:  0.5 mL    Refill:  0  . lisinopril (ZESTRIL) 40 MG tablet    Sig: Take 0.5 tablets (20 mg total) by mouth 2 (two) times daily.  Dispense:  90 tablet    Refill:  3    Requesting 1 year supply  . levothyroxine (SYNTHROID) 50 MCG tablet    Sig: Take 1 tablet (50 mcg total) by mouth daily before breakfast.    Dispense:  90 tablet    Refill:  3      Follow up plan: Return in about 1 year (around 04/27/2022) for 1 year fasting lab only then 1 week later Annual Physical.  [name], DO [facility] 04/27/2021, 2:00 PM

## 2021-04-27 NOTE — Patient Instructions (Addendum)
Thank you for coming to the office today.  Lab results are excellent. Keep up the great work.  Shingles vaccine shingrix at pharmacy  Call to schedule mammogram for 10/2021  For Mammogram screening for breast cancer   Call the [place] below anytime to schedule your own appointment now that order has been placed.  [facility] [place], [place] [address] Phone: [phone]    DUE for FASTING BLOOD WORK (no food or drink after midnight before the lab appointment, only water or coffee without cream/sugar on the morning of)  SCHEDULE "Lab Only" visit in the morning at the clinic for lab draw in 1 YEAR  - Make sure Lab Only appointment is at about 1 week before your next appointment, so that results will be available  For Lab Results, once available within 2-3 days of blood draw, you can can log in to MyChart online to view your results and a brief explanation. Also, we can discuss results at next follow-up visit.   Please schedule a Follow-up Appointment to: Return in about 1 year (around 04/27/2022) for 1 year fasting lab only then 1 week later Annual Physical.  If you have any other questions or concerns, please feel free to call the office or send a message through [place]. You may also schedule an earlier appointment if necessary.  Additionally, you may be receiving a survey about your experience at our office within a few days to 1 week by e-mail or mail. We value your feedback.  [name], DO [place]

## 2021-04-27 NOTE — Assessment & Plan Note (Signed)
Elevated initial BP, repeat manual improved Multiple occasions her home blood pressure cuff has been calibrated Home readings available, unchanged from prior 120-130s appropriate range Stable most consistent with white coat HTN No other known complication. History of hypotension / syncope Failed Amlodipine (swelling). Discontinued Triamterene-HCTZ in past.  Plan:  1. Continue current med Lisinopril 20mg  BID (half of 40mg  tab per dose) 2. Encourage improved lifestyle - continue low sodium diet, regular exercise 3. Continue close monitor BP outside office weekly, bring readings 4. Follow-up yearly

## 2021-04-27 NOTE — Assessment & Plan Note (Signed)
Controlled on lifestyle, fish oil Continue ASA Not on statin therapy, declines.

## 2021-08-02 ENCOUNTER — Telehealth: Payer: Self-pay | Admitting: Family Medicine

## 2021-08-02 NOTE — Telephone Encounter (Signed)
N/A, unable to leave a message for patient to call back and schedule the Medicare Annual Wellness Visit (AWV) virtually or by telephone.  Last AWV 08/05/19  Please schedule at anytime with [place].  40 minute appointment  Any questions, please call me at [phone]

## 2021-08-23 ENCOUNTER — Other Ambulatory Visit: Payer: Self-pay

## 2021-08-23 ENCOUNTER — Other Ambulatory Visit: Payer: Self-pay | Admitting: Family Medicine

## 2021-08-23 ENCOUNTER — Ambulatory Visit (INDEPENDENT_AMBULATORY_CARE_PROVIDER_SITE_OTHER): Payer: Medicare Other | Admitting: Family Medicine

## 2021-08-23 VITALS — Ht 66.0 in | Wt 120.0 lb

## 2021-08-23 DIAGNOSIS — Z23 Encounter for immunization: Secondary | ICD-10-CM | POA: Diagnosis not present

## 2021-08-23 NOTE — Addendum Note (Signed)
Addended by: Olin Hauser on: 08/23/2021 11:06 AM   Modules accepted: Orders

## 2022-01-27 IMAGING — MG DIGITAL SCREENING BILAT W/ TOMO W/ CAD
8 series · 9 of 24 positions shown · non-contrast
Comparison: Previous exam(s).

CLINICAL DATA: Screening.

EXAM:
DIGITAL SCREENING BILATERAL MAMMOGRAM WITH TOMO AND CAD

[R CC synth-2D]
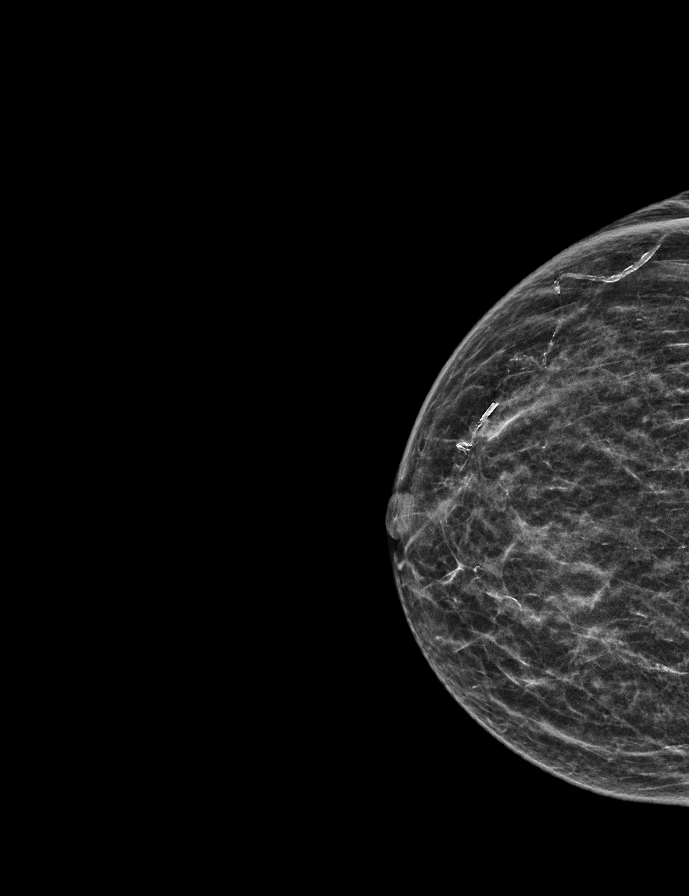

[R MLO synth-2D]
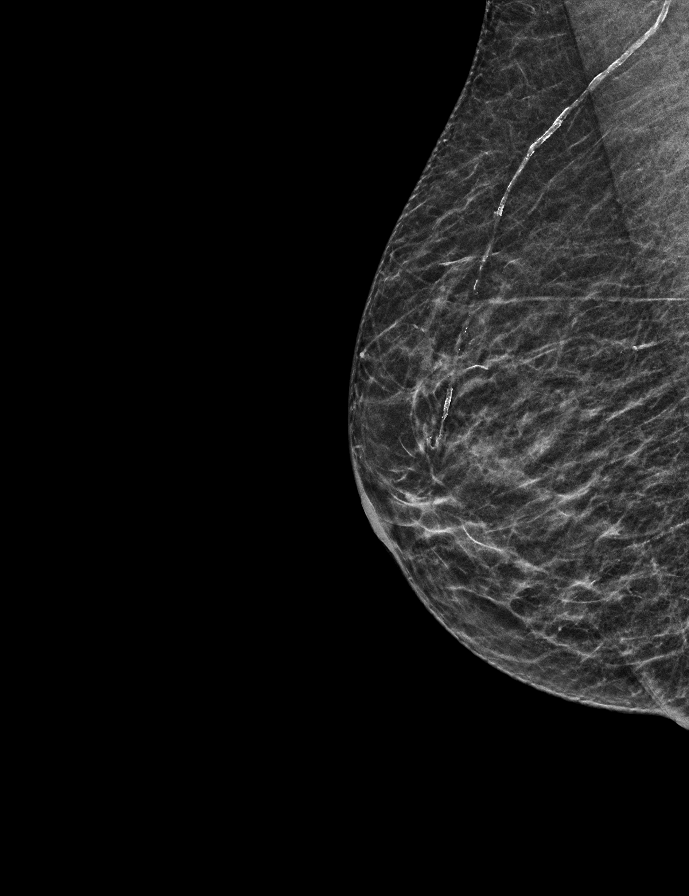

[L MLO synth-2D]
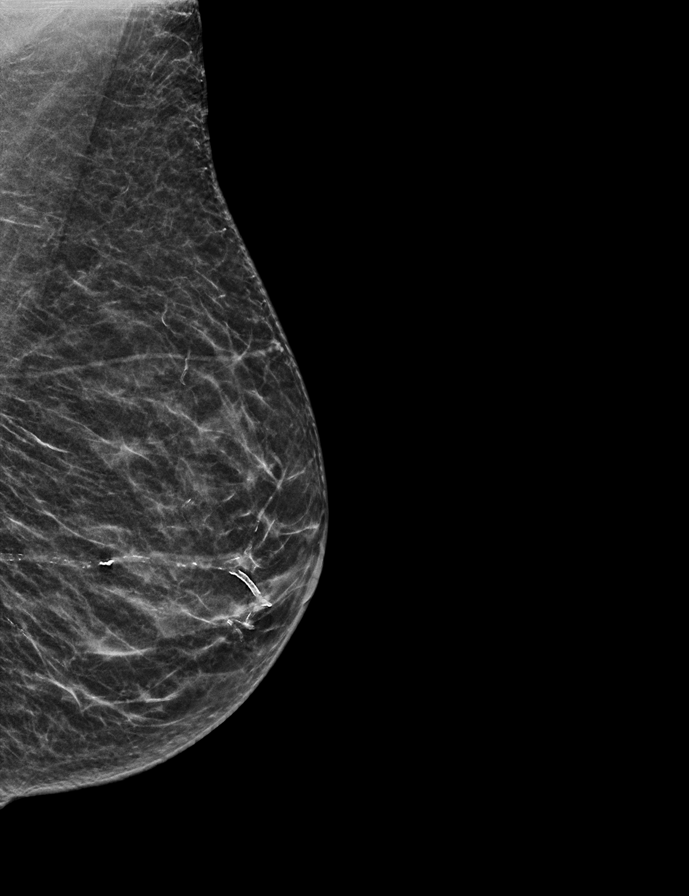

[L CC synth-2D]
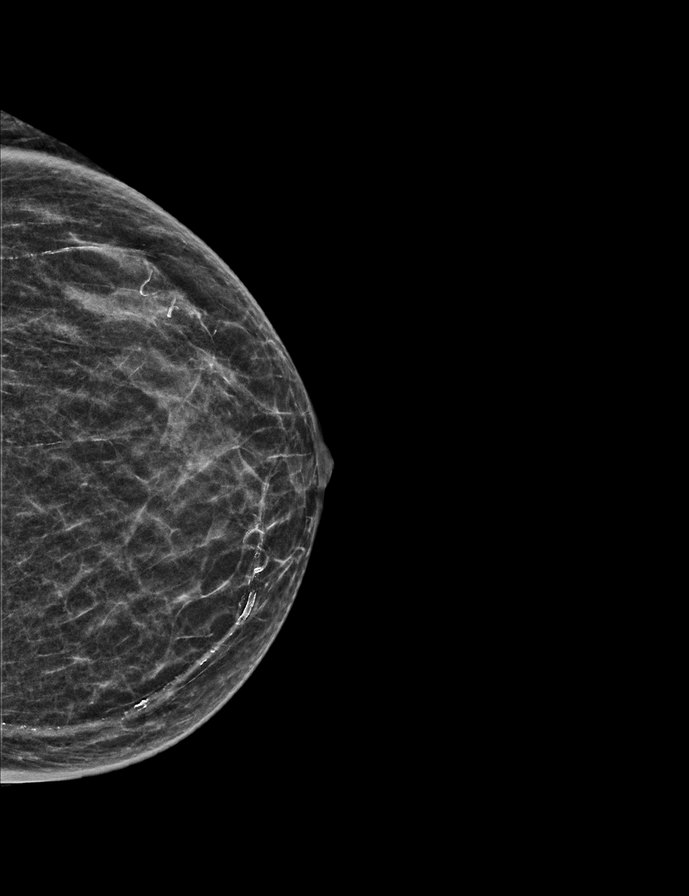

[L CC tomo · 2 of 37 frames shown]
[frame 13/37]
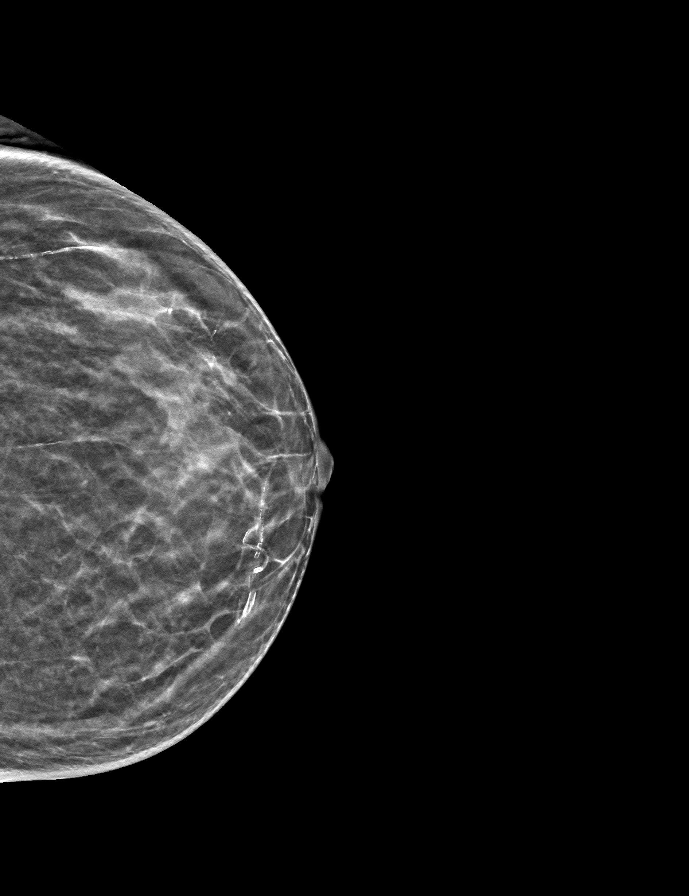
[frame 19/37]
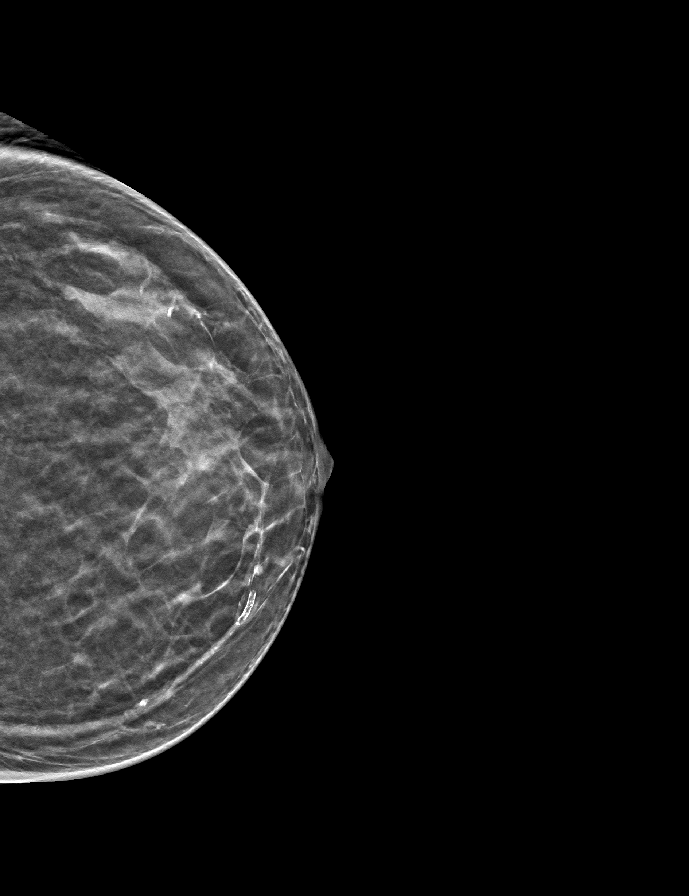

[L MLO tomo · tomo slice 18/35.0]
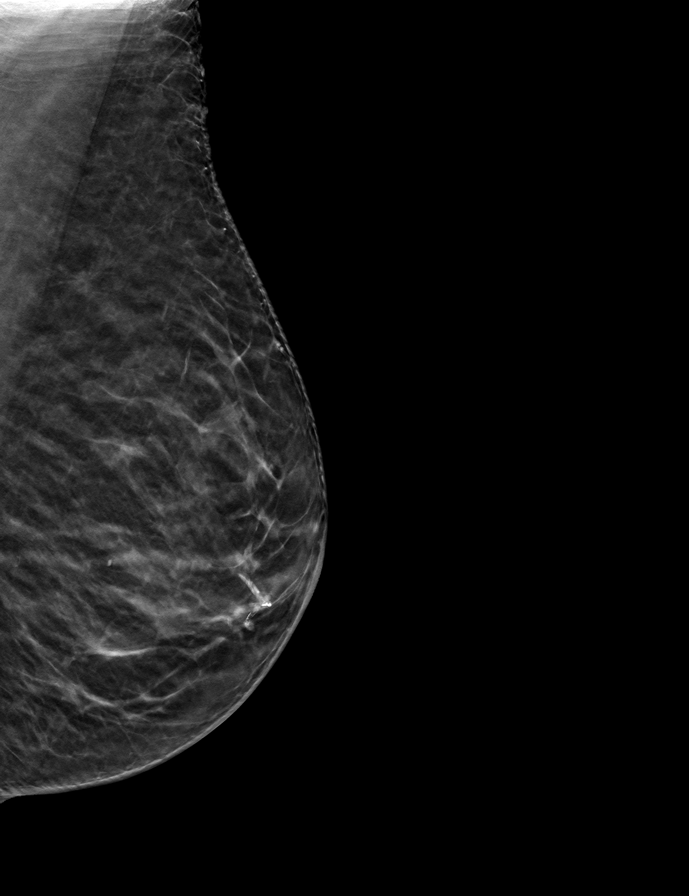

[R MLO tomo · tomo slice 16/31.0]
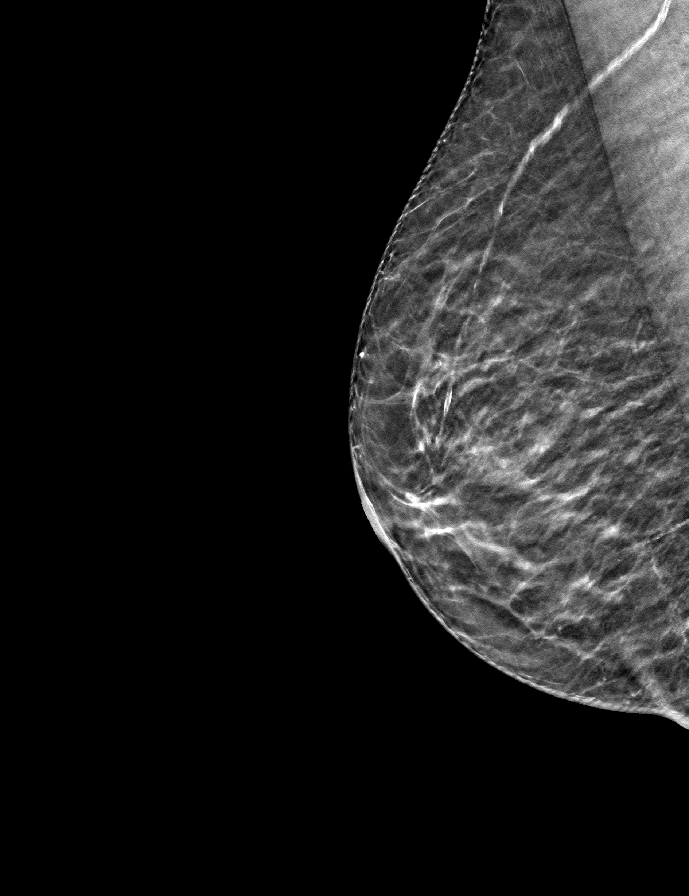

[R CC tomo · tomo slice 17/34.0]
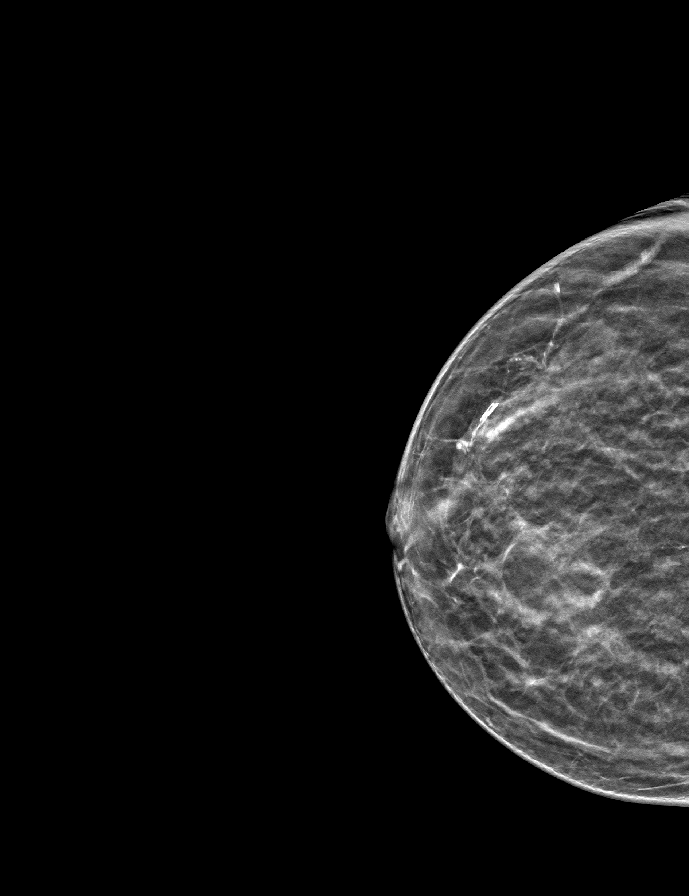

[9 of 24 positions shown; findings below may reference images not displayed]

ACR Breast Density Category b: There are scattered areas of
fibroglandular density.
FINDINGS: There are no findings suspicious for malignancy. Images were
processed with CAD.
IMPRESSION: No mammographic evidence of malignancy. A result letter of this
screening mammogram will be mailed directly to the patient.

RECOMMENDATION:
Screening mammogram in one year. (Code:[id number])

BI-RADS CATEGORY  1: Negative.

## 2022-03-01 ENCOUNTER — Other Ambulatory Visit: Payer: Self-pay | Admitting: Family Medicine

## 2022-03-01 DIAGNOSIS — E039 Hypothyroidism, unspecified: Secondary | ICD-10-CM

## 2022-03-02 NOTE — Telephone Encounter (Signed)
Requested Prescriptions  ?Pending Prescriptions Disp Refills  ?? levothyroxine (SYNTHROID) 50 MCG tablet [Pharmacy Med Name: Levothyroxine Sodium 50 MCG Oral Tablet] 90 tablet 3  ?  Sig: TAKE 1 TABLET BY MOUTH  DAILY BEFORE BREAKFAST  ?  ? Endocrinology:  Hypothyroid Agents Passed - 03/01/2022  6:41 AM  ?  ?  Passed - TSH in normal range and within 360 days  ?  TSH  ?Date Value Ref Range Status  ?04/20/2021 3.16 0.40 - 4.50 mIU/L Final  ?   ?  ?  Passed - Valid encounter within last 12 months  ?  Recent Outpatient Visits   ?      ? 6 months ago Needs flu shot  ? [place], DO  ? 10 months ago Annual physical exam  ? [place], DO  ? 1 year ago SBO (small bowel obstruction) [facility])  ? [facility] [place], Devonne Doughty, DO  ? 1 year ago Annual physical exam  ? [place], DO  ? 2 years ago Annual physical exam  ? [place], DO  ?  ?  ?Future Appointments   ?        ? In 1 week [place]   ?  ? ?  ?  ?  ? ?

## 2022-03-09 ENCOUNTER — Ambulatory Visit (INDEPENDENT_AMBULATORY_CARE_PROVIDER_SITE_OTHER): Payer: Medicare Other

## 2022-03-09 VITALS — BP 162/84 | HR 76 | Temp 97.5°F | Resp 16 | Ht 65.0 in | Wt 126.4 lb

## 2022-03-09 DIAGNOSIS — Z0000 Encounter for general adult medical examination without abnormal findings: Secondary | ICD-10-CM | POA: Diagnosis not present

## 2022-03-09 NOTE — Progress Notes (Signed)
? ?Subjective:  ? Brittany Oneal is a 88 y.o. female who presents for Medicare Annual (Subsequent) preventive examination. ? ?Review ? ?Per HPI unless specifically indicated below   ?  ? ?   ?Objective:  ?  ?Today's Vitals  ? 03/09/22 1013  ?BP: (!) 162/84  ?Pulse: 76  ?Resp: 16  ?Temp: (!) 97.5 ?F (36.4 ?C)  ?TempSrc: Oral  ?SpO2: 100%  ?Weight: 126 lb 6.4 oz (57.3 kg)  ?Height: '5\' 5"'$  (1.651 m)  ?PainSc: 0-No pain  ? ?Body mass index is 21.03 kg/m?. ? ? ?  08/05/2019  ? 11:04 AM 07/23/2018  ?  1:37 PM 10/24/2017  ?  8:10 AM 07/17/2017  ?  2:26 PM  ?Advanced Directives  ?Does Patient Have a Medical Advance Directive? Yes Yes Yes Yes  ?Type of Advance Directive Living will;Healthcare Power of Attorney Living will;Healthcare Power of Attorney Living will   ?Copy of [place] in Chart? Yes - validated most recent copy scanned in chart (See row information) No - copy requested    ? ? ?Current Medications (verified) ?Outpatient Encounter Medications as of 03/09/2022  ?Medication Sig  ? aspirin EC 81 MG tablet Take 1 tablet (81 mg total) by mouth daily.  ? fluticasone (FLONASE) 50 MCG/ACT nasal spray Place into both nostrils daily.  ? Glucosamine-Chondroitin 500-400 MG CAPS Take 2 capsules by mouth daily.  ? ipratropium (ATROVENT) 0.06 % nasal spray Place 2 sprays into both nostrils 4 (four) times daily as needed for rhinitis. For allergies and congestion as needed  ? levothyroxine (SYNTHROID) 50 MCG tablet TAKE 1 TABLET BY MOUTH  DAILY BEFORE BREAKFAST  ? lisinopril (ZESTRIL) 40 MG tablet Take 0.5 tablets (20 mg total) by mouth 2 (two) times daily.  ? Omega-3 Fatty Acids (FISH OIL) 1000 MG CAPS Take 1 capsule by mouth daily.   ? Psyllium 0.36 g CAPS Take by mouth.  ? [DISCONTINUED] SHINGRIX injection Inject 0.5 mL into muscle for shingles vaccine. 2nd dose.  ? ?No facility-administered encounter medications on file as of 03/09/2022.  ? ? ?Allergies (verified) ?Contrast media [iodinated contrast media], Sulfa  antibiotics, and Shellfish allergy  ? ?History: ?Past Medical History:  ?Diagnosis Date  ? Arthritis   ? Family history of malignant neoplasm of breast   ? Family history of malignant neoplasm of gastrointestinal tract   ? History of colon cancer   ? Personal history of malignant neoplasm of large intestine   ? Special screening for malignant neoplasms, colon   ? Thyroid disease   ? ?Past Surgical History:  ?Procedure Laterality Date  ? ABDOMINAL HYSTERECTOMY  1983  ? COLON SURGERY  1990  ? resection d/t cancer  ? COLONOSCOPY  1997,2001,2004,2006, 2011  ? COLONOSCOPY WITH PROPOFOL N/A 10/24/2017  ? Procedure: COLONOSCOPY WITH PROPOFOL;  Surgeon: Christene Lye, MD;  Location[facility] ENDOSCOPY;  Service: Endoscopy;  Laterality: N/A;  ? EYE SURGERY  Sept 2013  ? [place]  ? tumor of facial nerve  ? SALPINGOOPHORECTOMY    ? ?Family History  ?Problem Relation Age of Onset  ? Hypertension Mother   ? Hypertension Father   ? Diabetes Father   ? Cancer Other   ?     FH of breast and colon cancer  ? Cancer Brother   ?     colon  ? Cancer Sister   ?     breast  ? Breast cancer Sister 71  ? ?Social History  ? ?  Socioeconomic History  ? Marital status: Married  ?  Spouse name: Takila Kronberg  ? Number of children: 2  ? Years of education: Not on file  ? Highest education level: High school graduate  ?Occupational History  ? Occupation: Retired  ?Tobacco Use  ? Smoking status: Never  ? Smokeless tobacco: Never  ?Vaping Use  ? Vaping Use: Never used  ?Substance and Sexual Activity  ? Alcohol use: No  ?  Alcohol/week: 0.0 standard drinks  ? Drug use: No  ? Sexual activity: Not on file  ?Other Topics Concern  ? Not on file  ?Social History Narrative  ? Gym one to 2 days  Week   ? ?Social Determinants of Health  ? ?Financial Resource Strain: Low Risk   ? Difficulty of Paying Living Expenses: Not hard at all  ?Food Insecurity: No Food Insecurity  ? Worried About Charity fundraiser in the Last Year: Never true  ?  Ran Out of Food in the Last Year: Never true  ?Transportation Needs: No Transportation Needs  ? Lack of Transportation (Medical): No  ? Lack of Transportation (Non-Medical): No  ?Physical Activity: Insufficiently Active  ? Days of Exercise per Week: 5 days  ? Minutes of Exercise per Session: 20 min  ?Stress: No Stress Concern Present  ? Feeling of Stress : Not at all  ?Social Connections: Socially Integrated  ? Frequency of Communication with Friends and Family: More than three times a week  ? Frequency of Social Gatherings with Friends and Family: More than three times a week  ? Attends Religious Services: More than 4 times per year  ? Active Member of Clubs or Organizations: Yes  ? Attends Archivist Meetings: More than 4 times per year  ? Marital Status: Married  ? ? ?Tobacco Counseling ?Counseling given: Not Answered ? ? ?Clinical Intake: ? ?Pre-visit preparation completed: No ? ?Pain : No/denies pain ?Pain Score: 0-No pain ? ?  ? ?Nutritional Status: BMI of 19-24  Normal ?Nutritional Risks: None ?Diabetes: No ? ?How often do you need to have someone help you when you read instructions, pamphlets, or other written materials from your doctor or pharmacy?: 1 - Never ? ?Diabetic No  ? ?Interpreter Needed?: No ? ?Information entered by :: Donnie Mesa, CMA ? ? ?Activities of Daily Living ? ?  03/09/2022  ? 10:01 AM  ?In your present state of health, do you have any difficulty performing the following activities:  ?Hearing? 0  ?Vision? 1  ?Difficulty concentrating or making decisions? 1  ?Walking or climbing stairs? 0  ?Dressing or bathing? 0  ?Doing errands, shopping? 0  ? ? ?Patient Care Team: ?Olin Hauser, DO as PCP - General (Family Medicine) ?Christene Lye, MD (General Surgery) ?Dasher, Rayvon Char, MD (Dermatology) ? ?Indicate any recent Medical Services you may have received from other than [facility] providers in the past year (date may be approximate). ? ?No hospitalization in the past  12 mths.  ?   ?Assessment:  ? This is a routine wellness examination for Brittany Oneal. ? ?Hearing/Vision screen ?Vision Screening  ? Right eye Left eye Both eyes  ?Without correction     ?With correction '20/40 20/30 20/30 '$  ? ? ?Dietary issues and exercise activities discussed: ?Current Exercise Habits: The patient does not participate in regular exercise at present, Exercise limited by: Other - see comments ? ? Goals Addressed   ? ?  ?  ?  ?  ? This Visit's Progress  ?  Activity and Exercise Increased     ?  Evidence-based guidance:  ?Review current exercise levels.  ?Assess patient perspective on exercise or activity level, barriers to increasing activity, motivation and readiness for change.  ?Recommend or set healthy exercise goal based on individual tolerance.  ?Encourage small steps toward making change in amount of exercise or activity.  ?Urge reduction of sedentary activities or screen time.  ?Promote group activities within the community or with family or support person.  ?Consider referral to rehabiliation therapist for assessment and exercise/activity plan.   ?Notes:  ?  ? ?  ?Depression Screen ? ?  03/09/2022  ? 10:14 AM 03/09/2022  ? 10:02 AM 11/04/2020  ? 10:59 AM 04/16/2020  ?  2:02 PM 08/05/2019  ? 11:10 AM 04/18/2019  ?  2:13 PM 10/18/2018  ? 10:39 AM  ?PHQ 2/9 Scores  ?PHQ - 2 Score 0 0 0 0 0 0 0  ?PHQ- 9 Score  0       ?  ?Fall Risk ? ?  03/09/2022  ? 10:02 AM 04/27/2021  ?  1:43 PM 11/04/2020  ? 10:59 AM 04/16/2020  ?  2:02 PM 08/05/2019  ? 11:10 AM  ?Fall Risk   ?Falls in the past year? 0 0 0 0 0  ?Number falls in past yr: 0 0 0 0   ?Injury with Fall? 0 0 0 0   ?Risk for fall due to : No Fall Risks      ?Follow up Falls evaluation completed Falls evaluation completed Falls evaluation completed Falls evaluation completed   ? ? ?FALL RISK PREVENTION PERTAINING TO THE HOME: ? ?Any stairs in or around the home? No  ?If so, are there any without handrails? No  ?Home free of loose throw rugs in walkways, pet beds,  electrical cords, etc? No  ?Adequate lighting in your home to reduce risk of falls? Yes  ? ?ASSISTIVE DEVICES UTILIZED TO PREVENT FALLS: ? ?Life alert? No  ?Use of a cane, walker or w/c? No  ?Grab bars in

## 2022-03-09 NOTE — Patient Instructions (Signed)

## 2022-04-04 ENCOUNTER — Ambulatory Visit
Admission: RE | Admit: 2022-04-04 | Discharge: 2022-04-04 | Disposition: A | Discharge status: Home / Self Care | Payer: Medicare Other | Source: Ambulatory Visit | Attending: Family Medicine | Admitting: Family Medicine

## 2022-04-04 DIAGNOSIS — Z1231 Encounter for screening mammogram for malignant neoplasm of breast: Secondary | ICD-10-CM | POA: Insufficient documentation

## 2022-04-26 ENCOUNTER — Other Ambulatory Visit: Payer: Self-pay

## 2022-04-26 DIAGNOSIS — Z0000 Encounter for general adult medical examination without abnormal findings: Secondary | ICD-10-CM

## 2022-04-26 DIAGNOSIS — R7309 Other abnormal glucose: Secondary | ICD-10-CM

## 2022-04-26 DIAGNOSIS — E7800 Pure hypercholesterolemia, unspecified: Secondary | ICD-10-CM

## 2022-04-26 DIAGNOSIS — I10 Essential (primary) hypertension: Secondary | ICD-10-CM

## 2022-04-26 DIAGNOSIS — E039 Hypothyroidism, unspecified: Secondary | ICD-10-CM

## 2022-04-27 ENCOUNTER — Other Ambulatory Visit: Payer: Medicare Other

## 2022-04-27 DIAGNOSIS — Z0000 Encounter for general adult medical examination without abnormal findings: Secondary | ICD-10-CM

## 2022-04-27 DIAGNOSIS — R7309 Other abnormal glucose: Secondary | ICD-10-CM

## 2022-04-27 DIAGNOSIS — I10 Essential (primary) hypertension: Secondary | ICD-10-CM

## 2022-04-27 DIAGNOSIS — E7800 Pure hypercholesterolemia, unspecified: Secondary | ICD-10-CM

## 2022-04-27 DIAGNOSIS — E039 Hypothyroidism, unspecified: Secondary | ICD-10-CM

## 2022-04-28 ENCOUNTER — Ambulatory Visit: Payer: Self-pay

## 2022-04-28 LAB — CBC WITH DIFFERENTIAL/PLATELET
Absolute Monocytes: 537 cells/uL (ref 200–950)
Basophils Absolute: 40 cells/uL (ref 0–200)
Basophils Relative: 0.9 %
Eosinophils Absolute: 189 cells/uL (ref 15–500)
Eosinophils Relative: 4.3 %
HCT: 38.1 % (ref 35.0–45.0)
Hemoglobin: 12.4 g/dL (ref 11.7–15.5)
Lymphs Abs: 1817 cells/uL (ref 850–3900)
MCH: 28.9 pg (ref 27.0–33.0)
MCHC: 32.5 g/dL (ref 32.0–36.0)
MCV: 88.8 fL (ref 80.0–100.0)
MPV: 10.3 fL (ref 7.5–12.5)
Monocytes Relative: 12.2 %
Neutro Abs: 1817 cells/uL (ref 1500–7800)
Neutrophils Relative %: 41.3 %
Platelets: 194 10*3/uL (ref 140–400)
RBC: 4.29 10*6/uL (ref 3.80–5.10)
RDW: 13 % (ref 11.0–15.0)
Total Lymphocyte: 41.3 %
WBC: 4.4 10*3/uL (ref 3.8–10.8)

## 2022-04-28 LAB — LIPID PANEL
Cholesterol: 181 mg/dL (ref <200)
HDL: 72 mg/dL (ref >=50)
LDL Cholesterol (Calc): 95 mg/dL (calc)
Non-HDL Cholesterol (Calc): 109 mg/dL (calc) (ref <130)
Total CHOL/HDL Ratio: 2.5 (calc) (ref <5.0)
Triglycerides: 56 mg/dL (ref <150)

## 2022-04-28 LAB — COMPREHENSIVE METABOLIC PANEL
AG Ratio: 1.8 (calc) (ref 1.0–2.5)
ALT: 10 U/L (ref 6–29)
AST: 20 U/L (ref 10–35)
Albumin: 4 g/dL (ref 3.6–5.1)
Alkaline phosphatase (APISO): 51 U/L (ref 37–153)
BUN: 23 mg/dL (ref 7–25)
CO2: 28 mmol/L (ref 20–32)
Calcium: 9.3 mg/dL (ref 8.6–10.4)
Chloride: 98 mmol/L (ref 98–110)
Creat: 0.89 mg/dL (ref 0.60–0.95)
Globulin: 2.2 g/dL (calc) (ref 1.9–3.7)
Glucose, Bld: 86 mg/dL (ref 65–99)
Potassium: 4.4 mmol/L (ref 3.5–5.3)
Sodium: 135 mmol/L (ref 135–146)
Total Bilirubin: 0.8 mg/dL (ref 0.2–1.2)
Total Protein: 6.2 g/dL (ref 6.1–8.1)

## 2022-04-28 LAB — HEMOGLOBIN A1C
Hgb A1c MFr Bld: 5.4 % of total Hgb (ref <5.7)
Mean Plasma Glucose: 108 mg/dL
eAG (mmol/L): 6 mmol/L

## 2022-04-28 LAB — TSH: TSH: 2.04 mIU/L (ref 0.40–4.50)

## 2022-04-28 LAB — T4, FREE: Free T4: 1.2 ng/dL (ref 0.8–1.8)

## 2022-04-28 NOTE — Telephone Encounter (Signed)
     Chief Complaint: High BP today - 191/109  then came down to 161/75. Has not had her BP medication yet. Instructed to take it now. Symptoms: Has some weakness Frequency: Today Pertinent Negatives: Patient denies any other symptoms Disposition: '[]'$ ED /'[]'$ Urgent Care (no appt availability in office) / '[]'$ Appointment(In office/virtual)/ '[]'$  [place] [facility]/ '[]'$ Home Care/ '[]'$ Refused Recommended Disposition /'[]'$ [place] [facility]/ '[x]'$  Follow-up with PCP Additional Notes: No available appointments, asking to be worked in today.Please advise. Instructed to go to ED for worsening of symptoms. Answer Assessment - Initial Assessment Questions 1. BLOOD PRESSURE: "What is the blood pressure?" "Did you take at least two measurements 5 minutes apart?"     191/109 2. ONSET: "When did you take your blood pressure?"     Today 3. HOW: "How did you obtain the blood pressure?" (e.g., visiting nurse, automatic home BP monitor)     Home cuff 4. HISTORY: "Do you have a history of high blood pressure?"     Yes 5. MEDICATIONS: "Are you taking any medications for blood pressure?" "Have you missed any doses recently?"     Yes 6. OTHER SYMPTOMS: "Do you have any symptoms?" (e.g., headache, chest pain, blurred vision, difficulty breathing, weakness)     Weak 7. PREGNANCY: "Is there any chance you are pregnant?" "When was your last menstrual period?"     No  Protocols used: Blood Pressure - High-A-AH

## 2022-05-03 ENCOUNTER — Other Ambulatory Visit: Payer: Medicare Other

## 2022-05-03 ENCOUNTER — Encounter: Payer: Self-pay | Admitting: Family Medicine

## 2022-05-03 ENCOUNTER — Ambulatory Visit (INDEPENDENT_AMBULATORY_CARE_PROVIDER_SITE_OTHER): Payer: Medicare Other | Admitting: Family Medicine

## 2022-05-03 VITALS — BP 138/86 | HR 71 | Ht 65.0 in | Wt 124.8 lb

## 2022-05-03 DIAGNOSIS — E039 Hypothyroidism, unspecified: Secondary | ICD-10-CM | POA: Diagnosis not present

## 2022-05-03 DIAGNOSIS — Z85038 Personal history of other malignant neoplasm of large intestine: Secondary | ICD-10-CM

## 2022-05-03 DIAGNOSIS — I10 Essential (primary) hypertension: Secondary | ICD-10-CM

## 2022-05-03 DIAGNOSIS — Z23 Encounter for immunization: Secondary | ICD-10-CM

## 2022-05-03 DIAGNOSIS — Z0000 Encounter for general adult medical examination without abnormal findings: Secondary | ICD-10-CM

## 2022-05-03 DIAGNOSIS — Z1231 Encounter for screening mammogram for malignant neoplasm of breast: Secondary | ICD-10-CM

## 2022-05-03 MED ORDER — LISINOPRIL 40 MG PO TABS: 20.0000 mg | ORAL_TABLET | Freq: Two times a day (BID) | ORAL | 3 refills | Status: DC

## 2022-05-03 MED ORDER — LEVOTHYROXINE SODIUM 50 MCG PO TABS: 50.0000 ug | ORAL_TABLET | Freq: Every day | ORAL | 3 refills | Status: DC

## 2022-05-03 NOTE — Progress Notes (Signed)
Subjective:    Patient ID: Brittany Oneal, female    DOB: 21-Aug-1931, 88 y.o.   MRN: [id number]  Brittany Oneal is a 88 y.o. female presenting on 05/03/2022 for Annual Exam   HPI  Here for Annual Physical and Lab Review.   CHRONIC HTN: Reports has some elevated readings in office but home BP readings are normal range 120-130s Lab showed normal creatinine Current Meds - Lisinopril '20mg'$  BID (half of '40mg'$ ) Reports good compliance, took meds today. Tolerating well, w/o complaints - Taking ASA '81mg'$  daily tolerating well    Hypothyroidism, chronic Last lab normal TSH Stable chronic problem, still taking Levothyroxine 28mg daily She denies any symptoms of low thyroid hormone at this time   Elevated A1c Last lab improved A1c 5.4 Meds: Never on medication for blood sugar Currently on ACEi Lifestyle: - Weight is stable - Diet (Still focusing on healthy diet, avoiding sweets, improving appetite, drinks mostly water, some tea, coffee)  - Exercise (Regular exercises mostly outdoor and at home) - Family history of DM2 (Father) Denies hypoglycemia   Mild Elevated LDL - Reports no concerns. Last lipid panel 03/2021 normal - Currently taking Fish Oil omega 3 1g daily - No known history of CAD, MI, CVA  - She has been taking ASA '81mg'$  daily since last visit, doing well without bleeding or other episodes - She has never been on Statin medicine, has declined this in the past     Seasonal Allergies   Rheumatoid Arthritis Bilateral hands. Chronic problem. No new worsening or flare or joint pain.\     Health Maintenance:  Updated Shingrix # 2  Due [id number]   Fam history of breast cancer - sisters and aunts. Next mammogram due 03/2023, she did one 03/2022 negative  Due for Colon CA Surveillance, asking to return to GI. Last done Colonoscopy 2018 Dr SJamal Collin Now retired.      05/03/2022    2:30 PM 03/09/2022   10:14 AM 03/09/2022   10:02 AM  Depression screen PHQ 2/9  Decreased  Interest 0 0 0  Down, Depressed, Hopeless 0 0 0  PHQ - 2 Score 0 0 0  Altered sleeping 0  0  Tired, decreased energy 0  0  Change in appetite 0  0  Feeling bad or failure about yourself  0  0  Trouble concentrating 0  0  Moving slowly or fidgety/restless 0  0  Suicidal thoughts 0  0  PHQ-9 Score 0  0  Difficult doing work/chores Not difficult at all  Not difficult at all    Past Medical History:  Diagnosis Date   Arthritis    Family history of malignant neoplasm of breast    Family history of malignant neoplasm of gastrointestinal tract    History of colon cancer    Personal history of malignant neoplasm of large intestine    Special screening for malignant neoplasms, colon    Thyroid disease    Past Surgical History:  Procedure Laterality Date   ABDOMINAL HYSTERECTOMY  1983   COLON SURGERY  1990   resection d/t cancer   COLONOSCOPY  1997,2001,2004,2006, 2011   COLONOSCOPY WITH PROPOFOL N/A 10/24/2017   Procedure: COLONOSCOPY WITH PROPOFOL;  Surgeon: SChristene Lye MD;  Location: [facility] ENDOSCOPY;  Service: Endoscopy;  Laterality: N/A;   EYE SURGERY  Sept 2013   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   SALPINGOOPHORECTOMY     Social History  Socioeconomic History   Marital status: Married    Spouse name: Liara Holm   Number of children: 2   Years of education: Not on file   Highest education level: High school graduate  Occupational History   Occupation: Retired  Tobacco Use   Smoking status: Never   Smokeless tobacco: Never  Vaping Use   Vaping Use: Never used  Substance and Sexual Activity   Alcohol use: No    Alcohol/week: 0.0 standard drinks of alcohol   Drug use: No   Sexual activity: Not on file  Other Topics Concern   Not on file  Social History Narrative   Gym one to 2 days  Week    Social Determinants of Health   Financial Resource Strain: Low Risk  (03/09/2022)   Overall Financial Resource Strain (CARDIA)    Difficulty  of Paying Living Expenses: Not hard at all  Food Insecurity: No Food Insecurity (03/09/2022)   Hunger Vital Sign    Worried About Running Out of Food in the Last Year: Never true    [place] in the Last Year: Never true  Transportation Needs: No Transportation Needs (03/09/2022)   PRAPARE - Hydrologist (Medical): No    Lack of Transportation (Non-Medical): No  Physical Activity: Insufficiently Active (03/09/2022)   Exercise Vital Sign    Days of Exercise per Week: 5 days    Minutes of Exercise per Session: 20 min  Stress: No Stress Concern Present (03/09/2022)   Ord    Feeling of Stress : Not at all  Social Connections: [place] (03/09/2022)   Social Connection and Isolation Panel [NHANES]    Frequency of Communication with Friends and Family: More than three times a week    Frequency of Social Gatherings with Friends and Family: More than three times a week    Attends Religious Services: More than 4 times per year    Active Member of [employer] or Organizations: Yes    Attends Music therapist: More than 4 times per year    Marital Status: Married  Human resources officer Violence: Not At Risk (03/09/2022)   Humiliation, Afraid, Rape, and Kick questionnaire    Fear of Current or Ex-Partner: No    Emotionally Abused: No    Physically Abused: No    Sexually Abused: No   Family History  Problem Relation Age of Onset   Hypertension Mother    Hypertension Father    Diabetes Father    Cancer Other        FH of breast and colon cancer   Cancer Brother        colon   Cancer Sister        breast   Breast cancer Sister 75   Current Outpatient Medications on File Prior to Visit  Medication Sig   aspirin EC 81 MG tablet Take 1 tablet (81 mg total) by mouth daily.   fluticasone (FLONASE) 50 MCG/ACT nasal spray Place into both nostrils daily.   Glucosamine-Chondroitin  500-400 MG CAPS Take 2 capsules by mouth daily.   ipratropium (ATROVENT) 0.06 % nasal spray Place 2 sprays into both nostrils 4 (four) times daily as needed for rhinitis. For allergies and congestion as needed   Omega-3 Fatty Acids (FISH OIL) 1000 MG CAPS Take 1 capsule by mouth daily.    Psyllium 0.36 g CAPS Take by mouth.   No current facility-administered medications  on file prior to visit.    Review of Systems  Constitutional:  Negative for activity change, appetite change, chills, diaphoresis, fatigue and fever.  HENT:  Negative for congestion and hearing loss.   Eyes:  Negative for visual disturbance.  Respiratory:  Negative for cough, chest tightness, shortness of breath and wheezing.   Cardiovascular:  Negative for chest pain, palpitations and leg swelling.  Gastrointestinal:  Negative for abdominal pain, constipation, diarrhea, nausea and vomiting.  Genitourinary:  Negative for dysuria, frequency and hematuria.  Musculoskeletal:  Negative for arthralgias and neck pain.  Skin:  Negative for rash.  Neurological:  Negative for dizziness, weakness, light-headedness, numbness and headaches.  Hematological:  Negative for adenopathy.  Psychiatric/Behavioral:  Negative for behavioral problems, dysphoric mood and sleep disturbance.    Per HPI unless specifically indicated above      Objective:    BP 138/86   Pulse 71   Ht '5\' 5"'$  (1.651 m)   Wt 124 lb 12.8 oz (56.6 kg)   SpO2 99%   BMI 20.77 kg/m   Wt Readings from Last 3 Encounters:  05/03/22 124 lb 12.8 oz (56.6 kg)  03/09/22 126 lb 6.4 oz (57.3 kg)  08/23/21 120 lb (54.4 kg)    Physical Exam Vitals and nursing note reviewed.  Constitutional:      General: She is not in acute distress.    Appearance: She is well-developed. She is not diaphoretic.     Comments: Well-appearing, comfortable, cooperative  HENT:     Head: Normocephalic and atraumatic.  Eyes:     General:        Right eye: No discharge.        Left eye:  No discharge.     Conjunctiva/sclera: Conjunctivae normal.     Pupils: Pupils are equal, round, and reactive to light.  Neck:     Thyroid: No thyromegaly.  Cardiovascular:     Rate and Rhythm: Normal rate and regular rhythm.     Pulses: Normal pulses.     Heart sounds: Normal heart sounds. No murmur heard. Pulmonary:     Effort: Pulmonary effort is normal. No respiratory distress.     Breath sounds: Normal breath sounds. No wheezing or rales.  Abdominal:     General: Bowel sounds are normal. There is no distension.     Palpations: Abdomen is soft. There is no mass.     Tenderness: There is no abdominal tenderness.  Musculoskeletal:        General: No tenderness. Normal range of motion.     Cervical back: Normal range of motion and neck supple.     Right lower leg: Edema present.     Left lower leg: Edema (mild ankle edema +1 to trace pitting, some mild discloration venous stasis. varicose veins) present.     Comments: Upper / Lower Extremities: - Normal muscle tone, strength bilateral upper extremities 5/5, lower extremities 5/5  Lymphadenopathy:     Cervical: No cervical adenopathy.  Skin:    General: Skin is warm and dry.     Findings: No erythema or rash.  Neurological:     Mental Status: She is alert and oriented to person, place, and time.     Comments: Distal sensation intact to light touch all extremities  Psychiatric:        Mood and Affect: Mood normal.        Behavior: Behavior normal.        Thought Content: Thought content normal.  Comments: Well groomed, good eye contact, normal speech and thoughts      MM 3D SCREEN BREAST BILATERAL [[id number]] Resulted: 04/05/22 1109  Order Status: Completed Updated: 04/05/22 1111  Narrative:    CLINICAL DATA:  Screening.   EXAM:  DIGITAL SCREENING BILATERAL MAMMOGRAM WITH TOMOSYNTHESIS AND CAD   TECHNIQUE:  Bilateral screening digital craniocaudal and mediolateral oblique  mammograms were obtained. Bilateral  screening digital breast  tomosynthesis was performed. The images were evaluated with  computer-aided detection.   COMPARISON:  Previous exam(s).   ACR Breast Density Category b: There are scattered areas of  fibroglandular density.   FINDINGS:  There are no findings suspicious for malignancy.   IMPRESSION:  No mammographic evidence of malignancy. A result letter of this  screening mammogram will be mailed directly to the patient.   RECOMMENDATION:  Screening mammogram in one year. (Code:SM-B-01Y)   BI-RADS CATEGORY  1: Negative.    Electronically Signed    By: Margarette Canada M.D.    On: 04/05/2022 11:09      Results for orders placed or performed in visit on 04/27/22  T4, free  Result Value Ref Range   Free T4 1.2 0.8 - 1.8 ng/dL  TSH  Result Value Ref Range   TSH 2.04 0.40 - 4.50 mIU/L  Lipid panel  Result Value Ref Range   Cholesterol 181 <200 mg/dL   HDL 72 > OR = 50 mg/dL   Triglycerides 56 <150 mg/dL   LDL Cholesterol (Calc) 95 mg/dL (calc)   Total CHOL/HDL Ratio 2.5 <5.0 (calc)   Non-HDL Cholesterol (Calc) 109 <130 mg/dL (calc)  Comprehensive Metabolic Panel (CMET)  Result Value Ref Range   Glucose, Bld 86 65 - 99 mg/dL   BUN 23 7 - 25 mg/dL   Creat 0.89 0.60 - 0.95 mg/dL   BUN/Creatinine Ratio NOT APPLICABLE 6 - 22 (calc)   Sodium 135 135 - 146 mmol/L   Potassium 4.4 3.5 - 5.3 mmol/L   Chloride 98 98 - 110 mmol/L   CO2 28 20 - 32 mmol/L   Calcium 9.3 8.6 - 10.4 mg/dL   Total Protein 6.2 6.1 - 8.1 g/dL   Albumin 4.0 3.6 - 5.1 g/dL   Globulin 2.2 1.9 - 3.7 g/dL (calc)   AG Ratio 1.8 1.0 - 2.5 (calc)   Total Bilirubin 0.8 0.2 - 1.2 mg/dL   Alkaline phosphatase (APISO) 51 37 - 153 U/L   AST 20 10 - 35 U/L   ALT 10 6 - 29 U/L  CBC with Differential  Result Value Ref Range   WBC 4.4 3.8 - 10.8 Thousand/uL   RBC 4.29 3.80 - 5.10 Million/uL   Hemoglobin 12.4 11.7 - 15.5 g/dL   HCT 38.1 35.0 - 45.0 %   MCV 88.8 80.0 - 100.0 fL   MCH 28.9 27.0 - 33.0 pg    MCHC 32.5 32.0 - 36.0 g/dL   RDW 13.0 11.0 - 15.0 %   Platelets 194 140 - 400 Thousand/uL   MPV 10.3 7.5 - 12.5 fL   Neutro Abs 1,817 1,500 - 7,800 cells/uL   Lymphs Abs 1,817 850 - 3,900 cells/uL   Absolute Monocytes 537 200 - 950 cells/uL   Eosinophils Absolute 189 15 - 500 cells/uL   Basophils Absolute 40 0 - 200 cells/uL   Neutrophils Relative % 41.3 %   Total Lymphocyte 41.3 %   Monocytes Relative 12.2 %   Eosinophils Relative 4.3 %   Basophils Relative 0.9 %  HgB  A1c  Result Value Ref Range   Hgb A1c MFr Bld 5.4 <5.7 % of total Hgb   Mean Plasma Glucose 108 mg/dL   eAG (mmol/L) 6.0 mmol/L      Assessment & Plan:   Problem List Items Addressed This Visit     Hypothyroidism (Chronic)    Asymptomatic Stable, controlled Last lab TSH normal. Continue Levothyroxine 5mg daily      Relevant Medications   levothyroxine (SYNTHROID) 50 MCG tablet   History of colon cancer   Relevant Orders   Ambulatory referral to Gastroenterology   Essential hypertension    Elevated initial BP, repeat manual improved Multiple occasions her home blood pressure cuff has been calibrated Home readings available, occasional elevated  Stable most consistent with white coat HTN No other known complication. History of hypotension / syncope Failed Amlodipine (swelling). Discontinued Triamterene-HCTZ in past.  Plan:  1. Continue current med Lisinopril '20mg'$  BID (half of '40mg'$  tab per dose) - may add extra half tab PRN if severe elevated BP again.  If BP severely elevated >180/100 AND does not improve over 1-2 hours of resting. May take an additional half tab Lisinopril, and can continue with PM dose as well when it is time.  If this happens more than 1 x a month, we can discuss again and change the treatment. If this is very rare, then we are okay.  2. Encourage improved lifestyle - continue low sodium diet, regular exercise 3. Continue close monitor BP outside office weekly, bring  readings 4. Follow-up yearly  Consider ARB in future      Relevant Medications   lisinopril (ZESTRIL) 40 MG tablet   Other Visit Diagnoses     Annual physical exam    -  Primary   Need for pneumococcal 20-valent conjugate vaccination       Relevant Orders   Pneumococcal conjugate vaccine 20-valent (Completed)   Encounter for screening mammogram for malignant neoplasm of breast           Updated Health Maintenance information Reviewed recent lab results with patient Encouraged improvement to lifestyle with diet and exercise Goal of weight loss   Requested refer to GI for Colonoscopy surveillance, w/ history of colon CA.  Orders Placed This Encounter  Procedures   Pneumococcal conjugate vaccine 20-valent   Ambulatory referral to Gastroenterology    Referral Priority:   Routine    Referral Type:   Consultation    Referral Reason:   Specialty Services Required    Number of Visits Requested:   1     Meds ordered this encounter  Medications   levothyroxine (SYNTHROID) 50 MCG tablet    Sig: Take 1 tablet (50 mcg total) by mouth daily before breakfast.    Dispense:  90 tablet    Refill:  3    Add refills / Requesting 1 year supply   lisinopril (ZESTRIL) 40 MG tablet    Sig: Take 0.5 tablets (20 mg total) by mouth 2 (two) times daily.    Dispense:  90 tablet    Refill:  3    Requesting 1 year supply      Follow up plan: Return in about 6 months (around 11/02/2022) for 6 month follow-up HTN and Lower Ext Edema / Circulation.  Future labs 05/02/23  ANobie Putnam DScottsburgMedical Group 05/03/2022, 1:54 PM

## 2022-05-03 NOTE — Patient Instructions (Addendum)
Thank you for coming to the office today.  Mammogram next year 03/2023  Referral to GI for evaluation of repeat colonoscopy. Last done 2018. Dr Theresa Mulligan [facility]) [address] [place], [place] [address] Phone: [phone]  Refilled medications.  If BP severely elevated >180/100 AND does not improve over 1-2 hours of resting. May take an additional half tab Lisinopril, and can continue with PM dose as well when it is time.  If this happens more than 1 x a month, we can discuss again and change the treatment. If this is very rare, then we are okay.  Use RICE therapy: - R - Rest / relative rest with activity modification avoid overuse of joint - I - Ice packs (make sure you use a towel or sock / something to protect skin) - C - Compression with stockings - E - Elevation - if significant swelling, lift leg above heart level (toes above your nose) to help reduce swelling, most helpful at night after day of being on your feet   Please schedule a Follow-up Appointment to: Return in about 6 months (around 11/02/2022) for 6 month follow-up HTN and Lower Ext Edema / Circulation.  If you have any other questions or concerns, please feel free to call the office or send a message through [place]. You may also schedule an earlier appointment if necessary.  Additionally, you may be receiving a survey about your experience at our office within a few days to 1 week by e-mail or mail. We value your feedback.  [name], DO [place]

## 2022-05-04 ENCOUNTER — Other Ambulatory Visit: Payer: Self-pay | Admitting: Family Medicine

## 2022-05-04 DIAGNOSIS — Z0000 Encounter for general adult medical examination without abnormal findings: Secondary | ICD-10-CM

## 2022-05-04 DIAGNOSIS — I10 Essential (primary) hypertension: Secondary | ICD-10-CM

## 2022-05-04 DIAGNOSIS — E039 Hypothyroidism, unspecified: Secondary | ICD-10-CM

## 2022-05-04 DIAGNOSIS — E7800 Pure hypercholesterolemia, unspecified: Secondary | ICD-10-CM

## 2022-05-04 DIAGNOSIS — R7309 Other abnormal glucose: Secondary | ICD-10-CM

## 2022-05-04 NOTE — Assessment & Plan Note (Addendum)
Elevated initial BP, repeat manual improved Multiple occasions her home blood pressure cuff has been calibrated Home readings available, occasional elevated  Stable most consistent with white coat HTN No other known complication. History of hypotension / syncope Failed Amlodipine (swelling). Discontinued Triamterene-HCTZ in past.  Plan:  1. Continue current med Lisinopril '20mg'$  BID (half of '40mg'$  tab per dose) - may add extra half tab PRN if severe elevated BP again.  If BP severely elevated >180/100 AND does not improve over 1-2 hours of resting. May take an additional half tab Lisinopril, and can continue with PM dose as well when it is time.  If this happens more than 1 x a month, we can discuss again and change the treatment. If this is very rare, then we are okay.  2. Encourage improved lifestyle - continue low sodium diet, regular exercise 3. Continue close monitor BP outside office weekly, bring readings 4. Follow-up yearly  Consider ARB in future

## 2022-05-04 NOTE — Assessment & Plan Note (Signed)
Asymptomatic Stable, controlled Last lab TSH normal. Continue Levothyroxine 68mg daily

## 2022-06-19 ENCOUNTER — Ambulatory Visit (INDEPENDENT_AMBULATORY_CARE_PROVIDER_SITE_OTHER): Payer: Medicare Other | Admitting: Gastroenterology

## 2022-06-19 ENCOUNTER — Encounter: Payer: Self-pay | Admitting: Gastroenterology

## 2022-06-19 VITALS — BP 172/99 | HR 80 | Temp 97.8°F | Wt 123.2 lb

## 2022-06-19 DIAGNOSIS — Z85038 Personal history of other malignant neoplasm of large intestine: Secondary | ICD-10-CM

## 2022-06-19 NOTE — Progress Notes (Signed)
[name] MD, MRCP(U.K) [address]  [place]  [place], [place] [address]  Main: [phone]  Fax: [phone]   Gastroenterology Consultation  Referring Provider:     [name] * Primary Care Physician:  Olin Hauser, DO Primary Gastroenterologist:  Dr. [name]  Reason for Consultation:     Referral for screening colonoscopy        HPI:   Brittany Oneal is a 88 y.o. y/o female referred for consultation & management  by Dr. Parks Ranger, Devonne Doughty, DO.  She has been referred to see me for a surveillance colonoscopy.  History of colon cancer last colonoscopy in 2018 by Dr. Evalee Mutton.Last endoscopy note from 2018 shows no abnormality. 04/27/2022: Hemoglobin 12.4 g, CMP no significant abnormalityShe states that she has had her colon cancer resected from the sigmoid colon over 50 years back.  No change in bowel movements presently.  Looking at the chart she has had 6 prior colonoscopies.  No lower GI symptoms no change in bowel habits no rectal bleeding.  She states she wanted my opinion whether she should get a colonoscopy at this point of time.  Past Medical History:  Diagnosis Date   Arthritis    Family history of malignant neoplasm of breast    Family history of malignant neoplasm of gastrointestinal tract    History of colon cancer    Personal history of malignant neoplasm of large intestine    Special screening for malignant neoplasms, colon    Thyroid disease     Past Surgical History:  Procedure Laterality Date   ABDOMINAL HYSTERECTOMY  [place]   resection d/t cancer   COLONOSCOPY  1997,2001,2004,2006, 2011   COLONOSCOPY WITH PROPOFOL N/A 10/24/2017   Procedure: COLONOSCOPY WITH PROPOFOL;  Surgeon: Christene Lye, MD;  Location: [facility] ENDOSCOPY;  Service: Endoscopy;  Laterality: N/A;   EYE SURGERY  Sept 2013   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 Authorizing Provider  aspirin EC 81 MG tablet Take 1 tablet (81 mg total) by mouth daily. 04/30/17   Karamalegos, Devonne Doughty, DO  fluticasone (FLONASE) 50 MCG/ACT nasal spray Place into both nostrils daily.    [provider]  Glucosamine-Chondroitin 500-400 MG CAPS Take 2 capsules by mouth daily.    [provider]  ipratropium (ATROVENT) 0.06 % nasal spray Place 2 sprays into both nostrils 4 (four) times daily as needed for rhinitis. For allergies and congestion as needed 04/16/20   Olin Hauser, DO  levothyroxine (SYNTHROID) 50 MCG tablet Take 1 tablet (50 mcg total) by mouth daily before breakfast. 05/03/22   Parks Ranger, Devonne Doughty, DO  lisinopril (ZESTRIL) 40 MG tablet Take 0.5 tablets (20 mg total) by mouth 2 (two) times daily. 05/03/22   Karamalegos, Devonne Doughty, DO  Omega-3 Fatty Acids (FISH OIL) 1000 MG CAPS Take 1 capsule by mouth daily.     [provider]  Psyllium 0.36 g CAPS Take by mouth.    [provider]    Family History  Problem Relation Age of Onset   Hypertension Mother    Hypertension Father    Diabetes Father    Cancer Other        FH of breast and colon cancer   Cancer Brother        colon  Cancer Sister        breast   Breast cancer Sister 46     Social History   Tobacco Use   Smoking status: Never   Smokeless tobacco: Never  Vaping Use   Vaping Use: Never used  Substance Use Topics   Alcohol use: No    Alcohol/week: 0.0 standard drinks of alcohol   Drug use: No    Allergies as of 06/19/2022 - Review Complete 05/03/2022  Allergen Reaction Noted   Contrast media [iodinated contrast media] Other (See Comments) 04/07/2013   Sulfa antibiotics Hives 04/07/2013   Shellfish allergy Rash 04/07/2013    Review of Systems:    All systems reviewed and negative except where noted in HPI.   Physical Exam:  There were no vitals taken for this visit. No  LMP recorded. Patient has had a hysterectomy. Psych:  Alert and cooperative. Normal mood and affect. General:   Alert,  Well-developed, well-nourished, pleasant and cooperative in NAD Head:  Normocephalic and atraumatic. Eyes:  Sclera clear, no icterus.   Conjunctiva pink. Ears:  Normal auditory acuity. Neurologic:  Alert and oriented x3;  grossly normal neurologically. Psych:  Alert and cooperative. Normal mood and affect.  Imaging Studies: No results found.  Assessment and Plan:   Brittany Oneal is a 88 y.o. y/o female has been referred for a surveillance colonoscopy.  Used to see Dr. Jamal Collin who is retired.  History of colon cancer over 50 years back.  Has at least 6 colonoscopies over the last 20 years.  Last colonoscopy in 2018 was normal.  I have explained to her that usually we stop colonoscopy at the age of 75 as the risks and see the benefit ratio.  She has had a last one 5 years back when she was 5 and it would not be advised to stop further endoscopy procedures for surveillance due to the increased risk from anesthesia.  She agreed with my plan and would not wish to proceed with a colonoscopy at this point of time.  Explained to her if there were any change in her bowel habits any new symptoms then to come and see me back at that point of time.  Follow up as needed  Dr [name] MD,MRCP(U.K)

## 2022-08-28 ENCOUNTER — Ambulatory Visit (INDEPENDENT_AMBULATORY_CARE_PROVIDER_SITE_OTHER): Payer: Medicare Other

## 2022-08-28 DIAGNOSIS — Z23 Encounter for immunization: Secondary | ICD-10-CM

## 2022-11-02 ENCOUNTER — Encounter: Payer: Self-pay | Admitting: Family Medicine

## 2022-11-02 ENCOUNTER — Ambulatory Visit: Payer: Medicare Other | Admitting: Family Medicine

## 2022-11-02 VITALS — BP 131/67 | HR 76 | Ht 65.0 in | Wt 124.6 lb

## 2022-11-02 DIAGNOSIS — R040 Epistaxis: Secondary | ICD-10-CM

## 2022-11-02 DIAGNOSIS — R350 Frequency of micturition: Secondary | ICD-10-CM

## 2022-11-02 DIAGNOSIS — E039 Hypothyroidism, unspecified: Secondary | ICD-10-CM

## 2022-11-02 DIAGNOSIS — R7309 Other abnormal glucose: Secondary | ICD-10-CM | POA: Diagnosis not present

## 2022-11-02 DIAGNOSIS — I10 Essential (primary) hypertension: Secondary | ICD-10-CM

## 2022-11-02 NOTE — Patient Instructions (Addendum)
Thank you for coming to the office today.  RSV Vaccine at pharmacy  Nose bleed small ulcerated area near end of nose on inside R nostril, use neosporin Q tip gentle 1-2 times a day  May use afrin only for nose bleed rarely caution rebound swelling  DUE for FASTING BLOOD WORK (no food or drink after midnight before the lab appointment, only water or coffee without cream/sugar on the morning of)  SCHEDULE "Lab Only" visit in the morning at the clinic for lab draw in 6 MONTHS   - Make sure Lab Only appointment is at about 1 week before your next appointment, so that results will be available  For Lab Results, once available within 2-3 days of blood draw, you can can log in to MyChart online to view your results and a brief explanation. Also, we can discuss results at next follow-up visit.   Please schedule a Follow-up Appointment to: Return in about 6 months (around 05/04/2023) for 6 month fasting lab only then 1 week later Annual Physical.  If you have any other questions or concerns, please feel free to call the office or send a message through [place]. You may also schedule an earlier appointment if necessary.  Additionally, you may be receiving a survey about your experience at our office within a few days to 1 week by e-mail or mail. We value your feedback.  [name], DO [place]

## 2022-11-02 NOTE — Progress Notes (Signed)
Subjective:    Patient ID: Brittany Oneal, female    DOB: 1931-08-27, 88 y.o.   MRN: [id number]  Brittany Oneal is a 88 y.o. female presenting on 11/02/2022 for Hypertension   HPI  Sinusitis Allergies and Epistaxis Recently some R sided nare bleeding mixed with drainage Using Flonase Some dry nose it seems  Urinary urgency / frequency Recently onset urinary symptoms, asking for urine test today Denies any hematuria fever chills flank or back pain nausea vomiting   CHRONIC HTN: Reports has some elevated readings in office but home BP readings are normal range 120-130s Lab showed normal creatinine Current Meds - Lisinopril '20mg'$  BID (half of '40mg'$ ) Reports good compliance, took meds today. Tolerating well, w/o complaints - Taking ASA '81mg'$  daily tolerating well    Hypothyroidism, chronic Last lab normal TSH Stable chronic problem, still taking Levothyroxine 55mg daily She denies any symptoms of low thyroid hormone at this time       Health Maintenance:   RSV Vaccine requested, at pharmacy     11/02/2022   11:23 AM 05/03/2022    2:30 PM 03/09/2022   10:14 AM  Depression screen PHQ 2/9  Decreased Interest 0 0 0  Down, Depressed, Hopeless 0 0 0  PHQ - 2 Score 0 0 0  Altered sleeping 0 0   Tired, decreased energy 0 0   Change in appetite 0 0   Feeling bad or failure about yourself  0 0   Trouble concentrating 0 0   Moving slowly or fidgety/restless 0 0   Suicidal thoughts 0 0   PHQ-9 Score 0 0   Difficult doing work/chores Not difficult at all Not difficult at all     Social History   Tobacco Use   Smoking status: Never   Smokeless tobacco: Never  Vaping Use   Vaping Use: Never used  Substance Use Topics   Alcohol use: No    Alcohol/week: 0.0 standard drinks of alcohol   Drug use: No    Review of Systems Per HPI unless specifically indicated above     Objective:    BP 131/67 (BP Location: Left Arm, Cuff Size: Normal)   Pulse 76   Ht '5\' 5"'$  (1.651 m)    Wt 124 lb 9.6 oz (56.5 kg)   SpO2 100%   BMI 20.73 kg/m   Wt Readings from Last 3 Encounters:  11/02/22 124 lb 9.6 oz (56.5 kg)  06/19/22 123 lb 3.2 oz (55.9 kg)  05/03/22 124 lb 12.8 oz (56.6 kg)    Physical Exam Vitals and nursing note reviewed.  Constitutional:      General: She is not in acute distress.    Appearance: She is well-developed. She is not diaphoretic.     Comments: Well-appearing, comfortable, cooperative  HENT:     Head: Normocephalic and atraumatic.  Eyes:     General:        Right eye: No discharge.        Left eye: No discharge.     Conjunctiva/sclera: Conjunctivae normal.  Neck:     Thyroid: No thyromegaly.  Cardiovascular:     Rate and Rhythm: Normal rate and regular rhythm.     Heart sounds: Normal heart sounds. No murmur heard. Pulmonary:     Effort: Pulmonary effort is normal. No respiratory distress.     Breath sounds: Normal breath sounds. No wheezing or rales.  Musculoskeletal:        General: Normal range of motion.  Cervical back: Normal range of motion and neck supple.  Lymphadenopathy:     Cervical: No cervical adenopathy.  Skin:    General: Skin is warm and dry.     Findings: No erythema or rash.  Neurological:     Mental Status: She is alert and oriented to person, place, and time.  Psychiatric:        Behavior: Behavior normal.     Comments: Well groomed, good eye contact, normal speech and thoughts      Results for orders placed or performed in visit on 04/27/22  T4, free  Result Value Ref Range   Free T4 1.2 0.8 - 1.8 ng/dL  TSH  Result Value Ref Range   TSH 2.04 0.40 - 4.50 mIU/L  Lipid panel  Result Value Ref Range   Cholesterol 181 <200 mg/dL   HDL 72 > OR = 50 mg/dL   Triglycerides 56 <150 mg/dL   LDL Cholesterol (Calc) 95 mg/dL (calc)   Total CHOL/HDL Ratio 2.5 <5.0 (calc)   Non-HDL Cholesterol (Calc) 109 <130 mg/dL (calc)  Comprehensive Metabolic Panel (CMET)  Result Value Ref Range   Glucose, Bld 86 65 -  99 mg/dL   BUN 23 7 - 25 mg/dL   Creat 0.89 0.60 - 0.95 mg/dL   BUN/Creatinine Ratio NOT APPLICABLE 6 - 22 (calc)   Sodium 135 135 - 146 mmol/L   Potassium 4.4 3.5 - 5.3 mmol/L   Chloride 98 98 - 110 mmol/L   CO2 28 20 - 32 mmol/L   Calcium 9.3 8.6 - 10.4 mg/dL   Total Protein 6.2 6.1 - 8.1 g/dL   Albumin 4.0 3.6 - 5.1 g/dL   Globulin 2.2 1.9 - 3.7 g/dL (calc)   AG Ratio 1.8 1.0 - 2.5 (calc)   Total Bilirubin 0.8 0.2 - 1.2 mg/dL   Alkaline phosphatase (APISO) 51 37 - 153 U/L   AST 20 10 - 35 U/L   ALT 10 6 - 29 U/L  CBC with Differential  Result Value Ref Range   WBC 4.4 3.8 - 10.8 Thousand/uL   RBC 4.29 3.80 - 5.10 Million/uL   Hemoglobin 12.4 11.7 - 15.5 g/dL   HCT 38.1 35.0 - 45.0 %   MCV 88.8 80.0 - 100.0 fL   MCH 28.9 27.0 - 33.0 pg   MCHC 32.5 32.0 - 36.0 g/dL   RDW 13.0 11.0 - 15.0 %   Platelets 194 140 - 400 Thousand/uL   MPV 10.3 7.5 - 12.5 fL   Neutro Abs 1,817 1,500 - 7,800 cells/uL   Lymphs Abs 1,817 850 - 3,900 cells/uL   Absolute Monocytes 537 200 - 950 cells/uL   Eosinophils Absolute 189 15 - 500 cells/uL   Basophils Absolute 40 0 - 200 cells/uL   Neutrophils Relative % 41.3 %   Total Lymphocyte 41.3 %   Monocytes Relative 12.2 %   Eosinophils Relative 4.3 %   Basophils Relative 0.9 %  HgB A1c  Result Value Ref Range   Hgb A1c MFr Bld 5.4 <5.7 % of total Hgb   Mean Plasma Glucose 108 mg/dL   eAG (mmol/L) 6.0 mmol/L      Assessment & Plan:   Problem List Items Addressed This Visit     Hypothyroidism (Chronic)   Elevated hemoglobin A1c   Essential hypertension   Other Visit Diagnoses     Urinary frequency    -  Primary   Relevant Orders   Urinalysis, Routine w reflex microscopic   Urine  Culture   Epistaxis           Continue thyroid medication no change  HYPERTENSION Home readings normal White coat BP elevated here Continue current therapy  Epistaxis Visualized area anterior R nare today with some superficial bleed that is likely  source, probably dry nasal mucosa with flonase in winter and sinus drainage. Recommend nasal saline and neosporin as advised Limit Flonase for now. AVS info on epistaxis if need  Urinary frequency urgency Check Urinalysis and Urine culture Call back with results and treatment plan if indicated only.  Orders Placed This Encounter  Procedures   Urine Culture   Urinalysis, Routine w reflex microscopic     No orders of the defined types were placed in this encounter.   Follow up plan: Return in about 6 months (around 05/04/2023) for 6 month fasting lab only then 1 week later Annual Physical.    [name], DO [facility] 11/02/2022, 11:42 AM

## 2022-11-03 LAB — URINALYSIS, ROUTINE W REFLEX MICROSCOPIC
Bilirubin Urine: NEGATIVE
Glucose, UA: NEGATIVE
Hgb urine dipstick: NEGATIVE
Ketones, ur: NEGATIVE
Leukocytes,Ua: NEGATIVE
Nitrite: NEGATIVE
Protein, ur: NEGATIVE
Specific Gravity, Urine: 1.01 (ref 1.001–1.035)
pH: 6 (ref 5.0–8.0)

## 2022-11-03 LAB — URINE CULTURE
MICRO NUMBER:: 14285147
Result:: NO GROWTH
SPECIMEN QUALITY:: ADEQUATE

## 2023-01-04 ENCOUNTER — Ambulatory Visit: Payer: Self-pay

## 2023-01-04 NOTE — Telephone Encounter (Signed)
  Chief Complaint: irregular heartbeat Symptoms: irregular heartbeat heard and felt on assessment, mild PAD in LLE  Frequency: today during Home Visit Pertinent Negatives: Patient denies any symptoms  Disposition: '[]'$ ED /'[]'$ Urgent Care (no appt availability in office) / '[]'$ Appointment(In office/virtual)/ '[]'$  [facility]/ '[]'$ Home Care/ '[]'$ Refused Recommended Disposition /'[]'$ [facility]/ '[x]'$  Follow-up with PCP Additional Notes: Brittany Cruz, NP with [facility] calling to see if pt had previous dx of afib d/t having irregular HR today. Advised on HTN as dx. Pt asymptomatic. Tried to schedule OV for tomorrow or Monday 01/08/23 but pt refused, advised Brittany Oneal would pass along to provider and have someone FU with pt. Brittany Cruz, NP also reported mild PAD in LLE. Pt takes ASA '81mg'$  QD.   Reason for Disposition . Age > 60 years  (Exception: Brief heartbeat symptoms that went away and now feels well.)  Answer Assessment - Initial Assessment Questions 1. DESCRIPTION: "Please describe your heart rate or heartbeat that you are having" (e.g., fast/slow, regular/irregular, skipped or extra beats, "palpitations")     Irregular  2. ONSET: "When did it start?" (Minutes, hours or days)      Today  4. PATTERN "Does it come and go, or has it been constant since it started?"  "Does it get worse with exertion?"   "Are you feeling it now?"     *No Answer* 6. HEART RATE: "Can you tell me your heart rate?" "How many beats in 15 seconds?"  (Note: not all patients can do this)      HR 76 BP 130/78 9. CARDIAC HISTORY: "Do you have any history of heart disease?" (e.g., heart attack, angina, bypass surgery, angioplasty, arrhythmia)      *No Answer* 10. OTHER SYMPTOMS: "Do you have any other symptoms?" (e.g., dizziness, chest pain, sweating, difficulty breathing)       Asymptomatic  Protocols used: Heart Rate and Heartbeat Questions-A-AH

## 2023-02-12 ENCOUNTER — Telehealth: Payer: Self-pay | Admitting: Family Medicine

## 2023-02-12 NOTE — Telephone Encounter (Signed)
Contacted Brittany Oneal to schedule their annual wellness visit. Appointment made for 03/15/2023.  Sherol Dade; Care Guide [facility] Direct Dial: [phone]

## 2023-03-15 ENCOUNTER — Ambulatory Visit (INDEPENDENT_AMBULATORY_CARE_PROVIDER_SITE_OTHER): Payer: Medicare Other

## 2023-03-15 VITALS — BP 148/80 | Ht 65.0 in | Wt 123.8 lb

## 2023-03-15 DIAGNOSIS — Z0000 Encounter for general adult medical examination without abnormal findings: Secondary | ICD-10-CM

## 2023-03-15 NOTE — Progress Notes (Signed)
Subjective:   Brittany Oneal is a 89 y.o. female who presents for Medicare Annual (Subsequent) preventive examination.  Review of Systems     Cardiac Risk Factors include: advanced age (>55men, >76 women);hypertension     Objective:    Today's Vitals   03/15/23 1458  BP: (!) 148/80  Weight: 123 lb 12.8 oz (56.2 kg)  Height:  (1.651 m)   Body mass index is 20.6 kg/m.     03/15/2023    3:03 PM 08/05/2019   11:04 AM 07/23/2018    1:37 PM 10/24/2017    8:10 AM 07/17/2017    2:26 PM  Advanced Directives  Does Patient Have a Medical Advance Directive? No Yes Yes Yes Yes  Type of Advance Directive  Living will;Healthcare Power of Attorney Living will;Healthcare Power of Attorney Living will   Copy of Healthcare Power of Attorney in Chart?  Yes - validated most recent copy scanned in chart (See row information) No - copy requested    Would patient like information on creating a medical advance directive? No - Patient declined        Current Medications (verified) Outpatient Encounter Medications as of 03/15/2023  Medication Sig   aspirin EC 81 MG tablet Take 1 tablet (81 mg total) by mouth daily.   Glucosamine-Chondroitin 500-400 MG CAPS Take 2 capsules by mouth daily.   levothyroxine (SYNTHROID) 50 MCG tablet Take 1 tablet (50 mcg total) by mouth daily before breakfast.   lisinopril (ZESTRIL) 40 MG tablet Take 0.5 tablets (20 mg total) by mouth 2 (two) times daily.   Omega-3 Fatty Acids (FISH OIL) 1000 MG CAPS Take 1 capsule by mouth daily.    fluticasone (FLONASE) 50 MCG/ACT nasal spray Place into both nostrils daily. (Patient not taking: Reported on 03/15/2023)   ipratropium (ATROVENT) 0.06 % nasal spray Place 2 sprays into both nostrils 4 (four) times daily as needed for rhinitis. For allergies and congestion as needed (Patient not taking: Reported on 03/15/2023)   No facility-administered encounter medications on file as of 03/15/2023.    Allergies (verified) Contrast  media [iodinated contrast media], Sulfa antibiotics, and Shellfish allergy   History: Past Medical History:  Diagnosis Date   Arthritis    Family history of malignant neoplasm of breast    Family history of malignant neoplasm of gastrointestinal tract    History of colon cancer    Personal history of malignant neoplasm of large intestine    Special screening for malignant neoplasms, colon    Thyroid disease    Past Surgical History:  Procedure Laterality Date   ABDOMINAL HYSTERECTOMY  1983   COLON SURGERY  1990   resection d/t cancer   COLONOSCOPY  1997,2001,2004,2006, 2011   COLONOSCOPY WITH PROPOFOL N/A 10/24/2017   Procedure: COLONOSCOPY WITH PROPOFOL;  Surgeon: Kieth Brightly, MD;  Location: [facility] ENDOSCOPY;  Service: Endoscopy;  Laterality: N/A;   EYE SURGERY  Sept 2013   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   SALPINGOOPHORECTOMY     Family History  Problem Relation Age of Onset   Hypertension Mother    Hypertension Father    Diabetes Father    Cancer Other        FH of breast and colon cancer   Cancer Brother        colon   Cancer Sister        breast   Breast cancer Sister 51   Social History   Socioeconomic  History   Marital status: Married    Spouse name: Alaniz Skidmore   Number of children: 2   Years of education: Not on file   Highest education level: High school graduate  Occupational History   Occupation: Retired  Tobacco Use   Smoking status: Never   Smokeless tobacco: Never  Vaping Use   Vaping Use: Never used  Substance and Sexual Activity   Alcohol use: No    Alcohol/week: 0.0 standard drinks of alcohol   Drug use: No   Sexual activity: Not on file  Other Topics Concern   Not on file  Social History Narrative   Gym one to 2 days  Week    Social Determinants of Health   Financial Resource Strain: Low Risk  (03/15/2023)   Overall Financial Resource Strain (CARDIA)    Difficulty of Paying Living Expenses: Not hard at  all  Food Insecurity: No Food Insecurity (03/15/2023)   Hunger Vital Sign    Worried About Running Out of Food in the Last Year: Never true    Ran Out of Food in the Last Year: Never true  Transportation Needs: No Transportation Needs (03/15/2023)   PRAPARE - Administrator, Civil Service (Medical): No    Lack of Transportation (Non-Medical): No  Physical Activity: Sufficiently Active (03/15/2023)   Exercise Vital Sign    Days of Exercise per Week: 3 days    Minutes of Exercise per Session: 60 min  Stress: No Stress Concern Present (03/15/2023)   [employer] of Occupational Health - Occupational Stress Questionnaire    Feeling of Stress : Not at all  Social Connections: Socially Integrated (03/15/2023)   Social Connection and Isolation Panel [NHANES]    Frequency of Communication with Friends and Family: More than three times a week    Frequency of Social Gatherings with Friends and Family: Three times a week    Attends Religious Services: More than 4 times per year    Active Member of Clubs or Organizations: Yes    Attends Engineer, structural: More than 4 times per year    Marital Status: Married    Tobacco Counseling Counseling given: Not Answered   Clinical Intake:  Pre-visit preparation completed: Yes  Pain : No/denies pain     Nutritional Risks: None Diabetes: No  How often do you need to have someone help you when you read instructions, pamphlets, or other written materials from your doctor or pharmacy?: 1 - Never  Diabetic?NO  Interpreter Needed?: No  Information entered by :: Kennedy Bucker, LPN   Activities of Daily Living    03/15/2023    3:03 PM  In your present state of health, do you have any difficulty performing the following activities:  Hearing? 0  Vision? 0  Difficulty concentrating or making decisions? 0  Walking or climbing stairs? 0  Dressing or bathing? 0  Doing errands, shopping? 0  Preparing Food and eating ? N   Using the Toilet? N  In the past six months, have you accidently leaked urine? N  Do you have problems with loss of bowel control? N  Managing your Medications? N  Managing your Finances? N  Housekeeping or managing your Housekeeping? N    Patient Care Team: Smitty Cords, DO as PCP - General (Family Medicine) Kieth Brightly, MD (General Surgery) Dasher, Cliffton Asters, MD (Dermatology)  Indicate any recent Medical Services you may have received from other than [facility] providers in the past year (  date may be approximate).     Assessment:   This is a routine wellness examination for Brittany Oneal.  Hearing/Vision screen Hearing Screening - Comments:: NO AIDS Vision Screening - Comments:: WEARS GLASSES= DR.DINGELDEIN  Dietary issues and exercise activities discussed: Current Exercise Habits: Home exercise routine, Type of exercise: walking, Time (Minutes): 60, Frequency (Times/Week): 3, Weekly Exercise (Minutes/Week): 180, Intensity: Mild   Goals Addressed             This Visit's Progress    DIET - EAT MORE FRUITS AND VEGETABLES         Depression Screen    03/15/2023    3:01 PM 11/02/2022   11:23 AM 05/03/2022    2:30 PM 03/09/2022   10:14 AM 03/09/2022   10:02 AM 11/04/2020   10:59 AM 04/16/2020    2:02 PM  PHQ 2/9 Scores  PHQ - 2 Score 0 0 0 0 0 0 0  PHQ- 9 Score 0 0 0  0      Fall Risk    03/15/2023    3:03 PM 11/02/2022   11:23 AM 05/03/2022    2:29 PM 03/09/2022   10:02 AM 04/27/2021    1:43 PM  Fall Risk   Falls in the past year? 0 0 0 0 0  Number falls in past yr: 0 0 0 0 0  Injury with Fall? 0 0 0 0 0  Risk for fall due to : No Fall Risks No Fall Risks No Fall Risks No Fall Risks   Follow up Falls prevention discussed;Falls evaluation completed Falls evaluation completed Falls evaluation completed Falls evaluation completed Falls evaluation completed    FALL RISK PREVENTION PERTAINING TO THE HOME:  Any stairs in or around the home? No  If so, are  there any without handrails? No  Home free of loose throw rugs in walkways, pet beds, electrical cords, etc? Yes  Adequate lighting in your home to reduce risk of falls? Yes   ASSISTIVE DEVICES UTILIZED TO PREVENT FALLS:  Life alert? No  Use of a cane, walker or w/c? No  Grab bars in the bathroom? Yes  Shower chair or bench in shower? No  Elevated toilet seat or a handicapped toilet? Yes   TIMED UP AND GO:  Was the test performed? Yes .  Length of time to ambulate 10 feet: 4 sec.   Gait steady and fast without use of assistive device  Cognitive Function:    08/22/2016   10:12 AM  MMSE - Mini Mental State Exam  Orientation to time 5  Orientation to Place 5  Registration 3  Attention/ Calculation 5  Recall 3  Language- name 2 objects 2  Language- repeat 1  Language- follow 3 step command 3  Language- read & follow direction 1  Write a sentence 1  Copy design 0  Total score 29        03/15/2023    3:06 PM 03/09/2022   10:03 AM 07/23/2018    2:08 PM 07/17/2017    2:29 PM  6CIT Screen  What Year? 0 points 0 points 0 points 0 points  What month? 0 points 0 points 0 points 0 points  What time? 0 points 0 points 0 points 0 points  Count back from 20 0 points 0 points 0 points 0 points  Months in reverse 0 points 0 points 0 points 0 points  Repeat phrase 0 points 2 points 2 points 2 points  Total Score 0 points 2  points 2 points 2 points    Immunizations Immunization History  Administered Date(s) Administered   Fluad Quad(high Dose 65+) 08/21/2019, 08/19/2020, 08/23/2021, 08/28/2022   Influenza, High Dose Seasonal PF 08/03/2015, 08/22/2016, 07/31/2017, 09/04/2018   Influenza, Seasonal, Injecte, Preservative Fre 08/20/2013   Influenza-Unspecified 08/03/2015   PFIZER(Purple Top)SARS-COV-2 Vaccination 12/23/2019, 01/13/2020, 09/23/2020   PNEUMOCOCCAL CONJUGATE-20 05/03/2022   Pneumococcal Conjugate-13 09/29/2014, 10/07/2015   Pneumococcal Polysaccharide-23 11/27/1994    Tdap 01/21/2014   Zoster Recombinat (Shingrix) 08/18/2019, 10/28/2019    TDAP status: Up to date  Flu Vaccine status: Up to date  Pneumococcal vaccine status: Up to date  Covid-19 vaccine status: Completed vaccines  Qualifies for Shingles Vaccine? Yes   Zostavax completed No   Shingrix Completed?: Yes  Screening Tests Health Maintenance  Topic Date Due   COVID-19 Vaccine (4 - 2023-24 season) 07/28/2022   INFLUENZA VACCINE  06/28/2023   DTaP/Tdap/Td (2 - Td or Tdap) 01/22/2024   Medicare Annual Wellness (AWV)  03/14/2024   Pneumonia Vaccine 27+ Years old  Completed   DEXA SCAN  Completed   Zoster Vaccines- Shingrix  Completed   HPV VACCINES  Aged Out    Health Maintenance  Health Maintenance Due  Topic Date Due   COVID-19 Vaccine (4 - 2023-24 season) 07/28/2022    Colorectal cancer screening: No longer required.   Mammogram status: No longer required due to AGE.  Lung Cancer Screening: (Low Dose CT Chest recommended if Age 77-80 years, 30 pack-year currently smoking OR have quit w/in 15years.) does not qualify.    Additional Screening:  Hepatitis C Screening: does not qualify; Completed NO  Vision Screening: Recommended annual ophthalmology exams for early detection of glaucoma and other disorders of the eye. Is the patient up to date with their annual eye exam?  Yes  Who is the provider or what is the name of the office in which the patient attends annual eye exams? DR.DINGELDEIN If pt is not established with a provider, would they like to be referred to a provider to establish care? No .   Dental Screening: Recommended annual dental exams for proper oral hygiene  Community Resource Referral / Chronic Care Management: CRR required this visit?  No   CCM required this visit?  No      Plan:     I have personally reviewed and noted the following in the patient's chart:   Medical and social history Use of alcohol, tobacco or illicit drugs  Current  medications and supplements including opioid prescriptions. Patient is not currently taking opioid prescriptions. Functional ability and status Nutritional status Physical activity Advanced directives List of other physicians Hospitalizations, surgeries, and ER visits in previous 12 months Vitals Screenings to include cognitive, depression, and falls Referrals and appointments  In addition, I have reviewed and discussed with patient certain preventive protocols, quality metrics, and best practice recommendations. A written personalized care plan for preventive services as well as general preventive health recommendations were provided to patient.     Hal Hope, LPN   2/84/1324   Nurse Notes: [facility]

## 2023-03-15 NOTE — Patient Instructions (Signed)
Brittany Oneal , Thank you for taking time to come for your Medicare Wellness Visit. I appreciate your ongoing commitment to your health goals. Please review the following plan we discussed and let me know if I can assist you in the future.   These are the goals we discussed:  Goals      Activity and Exercise Increased     Evidence-based guidance:  Review current exercise levels.  Assess patient perspective on exercise or activity level, barriers to increasing activity, motivation and readiness for change.  Recommend or set healthy exercise goal based on individual tolerance.  Encourage small steps toward making change in amount of exercise or activity.  Urge reduction of sedentary activities or screen time.  Promote group activities within the community or with family or support person.  Consider referral to rehabiliation therapist for assessment and exercise/activity plan.   Notes:      DIET - EAT MORE FRUITS AND VEGETABLES     Exercise 3x per week (30 min per time)     Patient and her spouse exercise twice a weekly. She says it would be a goal to be able to increase number of days. Her overall goal is to live a healthy life.     Increase water intake     Recommend drinking at least 4-5 glasses of water a day         This is a list of the screening recommended for you and due dates:  Health Maintenance  Topic Date Due   COVID-19 Vaccine (4 - 2023-24 season) 07/28/2022   Flu Shot  06/28/2023   DTaP/Tdap/Td vaccine (2 - Td or Tdap) 01/22/2024   Medicare Annual Wellness Visit  03/14/2024   Pneumonia Vaccine  Completed   DEXA scan (bone density measurement)  Completed   Zoster (Shingles) Vaccine  Completed   HPV Vaccine  Aged Out    Advanced directives: NO  Conditions/risks identified: NONE  Next appointment: Follow up in one year for your annual wellness visit 03/20/24 @ 3:00 PM IN PERSON   Preventive Care 65 Years and Older, Female Preventive care refers to lifestyle choices  and visits with your health care provider that can promote health and wellness. What does preventive care include? A yearly physical exam. This is also called an annual well check. Dental exams once or twice a year. Routine eye exams. Ask your health care provider how often you should have your eyes checked. Personal lifestyle choices, including: Daily care of your teeth and gums. Regular physical activity. Eating a healthy diet. Avoiding tobacco and drug use. Limiting alcohol use. Practicing safe sex. Taking low-dose aspirin every day. Taking vitamin and mineral supplements as recommended by your health care provider. What happens during an annual well check? The services and screenings done by your health care provider during your annual well check will depend on your age, overall health, lifestyle risk factors, and family history of disease. Counseling  Your health care provider may ask you questions about your: Alcohol use. Tobacco use. Drug use. Emotional well-being. Home and relationship well-being. Sexual activity. Eating habits. History of falls. Memory and ability to understand (cognition). Work and work Astronomer. Reproductive health. Screening  You may have the following tests or measurements: Height, weight, and BMI. Blood pressure. Lipid and cholesterol levels. These may be checked every 5 years, or more frequently if you are over 35 years old. Skin check. Lung cancer screening. You may have this screening every year starting at age 23 if  you have a 30-pack-year history of smoking and currently smoke or have quit within the past 15 years. Fecal occult blood test (FOBT) of the stool. You may have this test every year starting at age 68. Flexible sigmoidoscopy or colonoscopy. You may have a sigmoidoscopy every 5 years or a colonoscopy every 10 years starting at age 38. Hepatitis C blood test. Hepatitis B blood test. Sexually transmitted disease (STD)  testing. Diabetes screening. This is done by checking your blood sugar (glucose) after you have not eaten for a while (fasting). You may have this done every 1-3 years. Bone density scan. This is done to screen for osteoporosis. You may have this done starting at age 64. Mammogram. This may be done every 1-2 years. Talk to your health care provider about how often you should have regular mammograms. Talk with your health care provider about your test results, treatment options, and if necessary, the need for more tests. Vaccines  Your health care provider may recommend certain vaccines, such as: Influenza vaccine. This is recommended every year. Tetanus, diphtheria, and acellular pertussis (Tdap, Td) vaccine. You may need a Td booster every 10 years. Zoster vaccine. You may need this after age 66. Pneumococcal 13-valent conjugate (PCV13) vaccine. One dose is recommended after age 43. Pneumococcal polysaccharide (PPSV23) vaccine. One dose is recommended after age 62. Talk to your health care provider about which screenings and vaccines you need and how often you need them. This information is not intended to replace advice given to you by your health care provider. Make sure you discuss any questions you have with your health care provider. Document Released: 12/10/2015 Document Revised: 08/02/2016 Document Reviewed: 09/14/2015 Elsevier Interactive Patient Education  2017 [employer].  Fall Prevention in the Home Falls can cause injuries. They can happen to people of all ages. There are many things you can do to make your home safe and to help prevent falls. What can I do on the outside of my home? Regularly fix the edges of walkways and driveways and fix any cracks. Remove anything that might make you trip as you walk through a door, such as a raised step or threshold. Trim any bushes or trees on the path to your home. Use bright outdoor lighting. Clear any walking paths of anything that  might make someone trip, such as rocks or tools. Regularly check to see if handrails are loose or broken. Make sure that both sides of any steps have handrails. Any raised decks and porches should have guardrails on the edges. Have any leaves, snow, or ice cleared regularly. Use sand or salt on walking paths during winter. Clean up any spills in your garage right away. This includes oil or grease spills. What can I do in the bathroom? Use night lights. Install grab bars by the toilet and in the tub and shower. Do not use towel bars as grab bars. Use non-skid mats or decals in the tub or shower. If you need to sit down in the shower, use a plastic, non-slip stool. Keep the floor dry. Clean up any water that spills on the floor as soon as it happens. Remove soap buildup in the tub or shower regularly. Attach bath mats securely with double-sided non-slip rug tape. Do not have throw rugs and other things on the floor that can make you trip. What can I do in the bedroom? Use night lights. Make sure that you have a light by your bed that is easy to reach. Do not  use any sheets or blankets that are too big for your bed. They should not hang down onto the floor. Have a firm chair that has side arms. You can use this for support while you get dressed. Do not have throw rugs and other things on the floor that can make you trip. What can I do in the kitchen? Clean up any spills right away. Avoid walking on wet floors. Keep items that you use a lot in easy-to-reach places. If you need to reach something above you, use a strong step stool that has a grab bar. Keep electrical cords out of the way. Do not use floor polish or wax that makes floors slippery. If you must use wax, use non-skid floor wax. Do not have throw rugs and other things on the floor that can make you trip. What can I do with my stairs? Do not leave any items on the stairs. Make sure that there are handrails on both sides of the  stairs and use them. Fix handrails that are broken or loose. Make sure that handrails are as long as the stairways. Check any carpeting to make sure that it is firmly attached to the stairs. Fix any carpet that is loose or worn. Avoid having throw rugs at the top or bottom of the stairs. If you do have throw rugs, attach them to the floor with carpet tape. Make sure that you have a light switch at the top of the stairs and the bottom of the stairs. If you do not have them, ask someone to add them for you. What else can I do to help prevent falls? Wear shoes that: Do not have high heels. Have rubber bottoms. Are comfortable and fit you well. Are closed at the toe. Do not wear sandals. If you use a stepladder: Make sure that it is fully opened. Do not climb a closed stepladder. Make sure that both sides of the stepladder are locked into place. Ask someone to hold it for you, if possible. Clearly mark and make sure that you can see: Any grab bars or handrails. First and last steps. Where the edge of each step is. Use tools that help you move around (mobility aids) if they are needed. These include: Canes. Walkers. Scooters. Crutches. Turn on the lights when you go into a dark area. Replace any light bulbs as soon as they burn out. Set up your furniture so you have a clear path. Avoid moving your furniture around. If any of your floors are uneven, fix them. If there are any pets around you, be aware of where they are. Review your medicines with your doctor. Some medicines can make you feel dizzy. This can increase your chance of falling. Ask your doctor what other things that you can do to help prevent falls. This information is not intended to replace advice given to you by your health care provider. Make sure you discuss any questions you have with your health care provider. Document Released: 09/09/2009 Document Revised: 04/20/2016 Document Reviewed: 12/18/2014 Elsevier Interactive  Patient Education  2017 [employer].

## 2023-03-19 ENCOUNTER — Other Ambulatory Visit: Payer: Self-pay | Admitting: Family Medicine

## 2023-03-19 ENCOUNTER — Telehealth: Payer: Self-pay

## 2023-03-19 DIAGNOSIS — Z1231 Encounter for screening mammogram for malignant neoplasm of breast: Secondary | ICD-10-CM

## 2023-03-19 NOTE — Telephone Encounter (Signed)
Copied from CRM [id number]. Topic: General - Inquiry >> Mar 19, 2023  1:14 PM Runell Gess P wrote: Reason for CRM: pt called saying she was in last week to see Dr. Kirtland Bouchard and her bp was up and she was told to cal back if it was up this week.  It is 144/85 and pulse was 74.  Leave a message if needed.  FYI  CB@  [phone]

## 2023-03-19 NOTE — Telephone Encounter (Signed)
Please notify patient.  Based on prior readings and this BP reading, I am okay with 140s/80s.  This is much better than the initial elevated reading in our office back in December 180/90+  I am okay with her current BP.  She is currently on Lisinopril splitting dose  tab into half  dose in AM and half dose in PM.  I would be cautious with adding more BP medications unless her BP is consistently >150/90.  Or if she is having symptomatic headaches or dizzy or other symptoms concerns.  She has failed and not tolerated some of the other common BP meds. So next if she had higher pressure consistently or if she prefers to take something else, I would suggest we can switch the Lisinopril med to a Valsartan and try one dose daily.  Saralyn Pilar, DO [facility] 03/19/2023, 5:40 PM

## 2023-03-20 NOTE — Telephone Encounter (Signed)
Spoke with patient and gave her Dr Althea Charon advice.

## 2023-04-06 ENCOUNTER — Other Ambulatory Visit: Payer: Self-pay | Admitting: Family Medicine

## 2023-04-06 DIAGNOSIS — I10 Essential (primary) hypertension: Secondary | ICD-10-CM

## 2023-04-09 NOTE — Telephone Encounter (Signed)
Requested medication (s) are due for refill today:   Yes  Requested medication (s) are on the active medication list:   Yes  Future visit scheduled:   Yes 05/09/2023   Last ordered: 05/03/2022 #90, 3 refills  Returned because labs due per protocol     Requested Prescriptions  Pending Prescriptions Disp Refills   lisinopril (ZESTRIL) 40 MG tablet [Pharmacy Med Name: Lisinopril 40 MG Oral Tablet] 90 tablet 3    Sig: TAKE ONE-HALF TABLET BY MOUTH  TWICE DAILY     Cardiovascular:  ACE Inhibitors Failed - 04/06/2023 10:07 PM      Failed - Cr in normal range and within 180 days    Creat  Date Value Ref Range Status  04/27/2022 0.89 0.60 - 0.95 mg/dL Final         Failed - K in normal range and within 180 days    Potassium  Date Value Ref Range Status  04/27/2022 4.4 3.5 - 5.3 mmol/L Final  03/09/2015 4.5 mmol/L Final    Comment:    3.5-5.1 NOTE: New Reference Range  02/02/15          Failed - Last BP in normal range    BP Readings from Last 1 Encounters:  03/15/23 (!) 148/80         Passed - Patient is not pregnant      Passed - Valid encounter within last 6 months    Recent Outpatient Visits           5 months ago Urinary frequency   [facility] Smitty Cords, DO   11 months ago Annual physical exam   [facility] Smitty Cords, DO   1 year ago Needs flu shot   [facility] Smitty Cords, DO   1 year ago Annual physical exam   [facility] Smitty Cords, DO   2 years ago SBO (small bowel obstruction) [facility])   [facility] Smitty Cords, DO       Future Appointments             In 1 month Althea Charon, Netta Neat, DO [facility], [facility]

## 2023-04-12 ENCOUNTER — Ambulatory Visit
Admission: RE | Admit: 2023-04-12 | Discharge: 2023-04-12 | Disposition: A | Discharge status: Home / Self Care | Payer: Medicare Other | Source: Ambulatory Visit | Attending: Family Medicine | Admitting: Family Medicine

## 2023-04-12 DIAGNOSIS — Z1231 Encounter for screening mammogram for malignant neoplasm of breast: Secondary | ICD-10-CM

## 2023-04-24 ENCOUNTER — Other Ambulatory Visit: Payer: Medicare Other

## 2023-04-24 DIAGNOSIS — E7800 Pure hypercholesterolemia, unspecified: Secondary | ICD-10-CM

## 2023-04-24 DIAGNOSIS — I10 Essential (primary) hypertension: Secondary | ICD-10-CM

## 2023-04-24 DIAGNOSIS — R7309 Other abnormal glucose: Secondary | ICD-10-CM

## 2023-04-24 DIAGNOSIS — E039 Hypothyroidism, unspecified: Secondary | ICD-10-CM

## 2023-04-24 DIAGNOSIS — Z0000 Encounter for general adult medical examination without abnormal findings: Secondary | ICD-10-CM

## 2023-04-24 LAB — CBC WITH DIFFERENTIAL/PLATELET
Absolute Monocytes: 545 cells/uL (ref 200–950)
Eosinophils Absolute: 171 cells/uL (ref 15–500)
Eosinophils Relative: 3.8 %
Lymphs Abs: 1818 cells/uL (ref 850–3900)
Neutrophils Relative %: 42.8 %
RDW: 13.1 % (ref 11.0–15.0)

## 2023-04-25 LAB — CBC WITH DIFFERENTIAL/PLATELET
Basophils Absolute: 41 cells/uL (ref 0–200)
Basophils Relative: 0.9 %
HCT: 39.7 % (ref 35.0–45.0)
Hemoglobin: 13.1 g/dL (ref 11.7–15.5)
MCH: 29.1 pg (ref 27.0–33.0)
MCHC: 33 g/dL (ref 32.0–36.0)
MCV: 88.2 fL (ref 80.0–100.0)
MPV: 10.7 fL (ref 7.5–12.5)
Monocytes Relative: 12.1 %
Neutro Abs: 1926 cells/uL (ref 1500–7800)
Platelets: 178 10*3/uL (ref 140–400)
RBC: 4.5 10*6/uL (ref 3.80–5.10)
Total Lymphocyte: 40.4 %
WBC: 4.5 10*3/uL (ref 3.8–10.8)

## 2023-04-25 LAB — COMPLETE METABOLIC PANEL WITH GFR
AG Ratio: 1.9 (calc) (ref 1.0–2.5)
ALT: 8 U/L (ref 6–29)
AST: 18 U/L (ref 10–35)
Albumin: 4.1 g/dL (ref 3.6–5.1)
Alkaline phosphatase (APISO): 48 U/L (ref 37–153)
BUN/Creatinine Ratio: 24 (calc) — ABNORMAL HIGH (ref 6–22)
BUN: 23 mg/dL (ref 7–25)
CO2: 27 mmol/L (ref 20–32)
Calcium: 9.1 mg/dL (ref 8.6–10.4)
Chloride: 101 mmol/L (ref 98–110)
Creat: 0.97 mg/dL — ABNORMAL HIGH (ref 0.60–0.95)
Globulin: 2.2 g/dL (calc) (ref 1.9–3.7)
Glucose, Bld: 84 mg/dL (ref 65–99)
Potassium: 4.2 mmol/L (ref 3.5–5.3)
Sodium: 138 mmol/L (ref 135–146)
Total Bilirubin: 0.9 mg/dL (ref 0.2–1.2)
Total Protein: 6.3 g/dL (ref 6.1–8.1)
eGFR: 56 mL/min/{1.73_m2} — ABNORMAL LOW (ref >=60)

## 2023-04-25 LAB — LIPID PANEL
Cholesterol: 186 mg/dL (ref <200)
HDL: 72 mg/dL (ref >=50)
LDL Cholesterol (Calc): 99 mg/dL (calc)
Non-HDL Cholesterol (Calc): 114 mg/dL (calc) (ref <130)
Total CHOL/HDL Ratio: 2.6 (calc) (ref <5.0)
Triglycerides: 64 mg/dL (ref <150)

## 2023-04-25 LAB — TSH: TSH: 2.25 mIU/L (ref 0.40–4.50)

## 2023-04-25 LAB — T4, FREE: Free T4: 1.2 ng/dL (ref 0.8–1.8)

## 2023-04-25 LAB — HEMOGLOBIN A1C
Hgb A1c MFr Bld: 5.6 % of total Hgb (ref <5.7)
Mean Plasma Glucose: 114 mg/dL
eAG (mmol/L): 6.3 mmol/L

## 2023-04-26 ENCOUNTER — Other Ambulatory Visit: Payer: Self-pay | Admitting: Family Medicine

## 2023-04-26 DIAGNOSIS — E039 Hypothyroidism, unspecified: Secondary | ICD-10-CM

## 2023-04-27 NOTE — Telephone Encounter (Signed)
Requested Prescriptions  Pending Prescriptions Disp Refills   levothyroxine (SYNTHROID) 50 MCG tablet [Pharmacy Med Name: Levothyroxine Sodium 50 MCG Oral Tablet] 90 tablet 3    Sig: TAKE 1 TABLET BY MOUTH DAILY  BEFORE BREAKFAST     Endocrinology:  Hypothyroid Agents Passed - 04/26/2023 10:53 PM      Passed - TSH in normal range and within 360 days    TSH  Date Value Ref Range Status  04/24/2023 2.25 0.40 - 4.50 mIU/L Final         Passed - Valid encounter within last 12 months    Recent Outpatient Visits           5 months ago Urinary frequency   [facility] Smitty Cords, DO   11 months ago Annual physical exam   [facility] Smitty Cords, DO   1 year ago Needs flu shot   [facility] Althea Charon, Netta Neat, DO   2 years ago Annual physical exam   [facility] Smitty Cords, DO   2 years ago SBO (small bowel obstruction) [facility])   [facility] Smitty Cords, DO       Future Appointments             In 1 week Althea Charon, Netta Neat, DO [facility], [facility]

## 2023-05-02 ENCOUNTER — Other Ambulatory Visit: Payer: Medicare Other

## 2023-05-03 ENCOUNTER — Other Ambulatory Visit: Payer: Medicare Other

## 2023-05-09 ENCOUNTER — Encounter: Payer: Self-pay | Admitting: Family Medicine

## 2023-05-09 ENCOUNTER — Ambulatory Visit (INDEPENDENT_AMBULATORY_CARE_PROVIDER_SITE_OTHER): Payer: Medicare Other | Admitting: Family Medicine

## 2023-05-09 VITALS — BP 138/85 | HR 78 | Temp 96.8°F | Wt 122.0 lb

## 2023-05-09 DIAGNOSIS — E039 Hypothyroidism, unspecified: Secondary | ICD-10-CM | POA: Diagnosis not present

## 2023-05-09 DIAGNOSIS — R29898 Other symptoms and signs involving the musculoskeletal system: Secondary | ICD-10-CM

## 2023-05-09 DIAGNOSIS — E7800 Pure hypercholesterolemia, unspecified: Secondary | ICD-10-CM

## 2023-05-09 DIAGNOSIS — R7309 Other abnormal glucose: Secondary | ICD-10-CM

## 2023-05-09 DIAGNOSIS — I10 Essential (primary) hypertension: Secondary | ICD-10-CM

## 2023-05-09 DIAGNOSIS — Z0000 Encounter for general adult medical examination without abnormal findings: Secondary | ICD-10-CM

## 2023-05-09 DIAGNOSIS — I872 Venous insufficiency (chronic) (peripheral): Secondary | ICD-10-CM

## 2023-05-09 MED ORDER — VALSARTAN 80 MG PO TABS
80.0000 mg | ORAL_TABLET | Freq: Every day | ORAL | 0 refills | Status: DC
Start: 2023-05-09 — End: 2023-05-23

## 2023-05-09 NOTE — Progress Notes (Signed)
Subjective:    Patient ID: Brittany Oneal, female    DOB: [birth year], 89 y.o.   MRN: [id number]  Brittany Oneal is a 89 y.o. female presenting on 05/09/2023 for No chief complaint on file.   HPI  Here for Annual Physical and Lab Review.   CHRONIC HTN: Elevated BP here in office Home BP readings 120-140 mostly 130s Lab showed mostly normal creatinine, slightly increased but overall not a concern Current Meds - Lisinopril 20mg  BID (half of 40mg ) Reports good compliance, took meds today. Tolerating well, w/o complaints - Taking ASA 81mg  daily tolerating well    Hypothyroidism, chronic Last lab normal TSH Stable chronic problem, still taking Levothyroxine daily She denies any symptoms of low thyroid hormone at this time   Elevated A1c A1c 5.6, slightly elevated from 5.4 Meds: Never on medication for blood sugar Currently on ACEi Lifestyle: - Weight is stable - Diet (Still focusing on healthy diet, avoiding sweets, improving appetite, drinks mostly water, some tea, coffee)  - Exercise (Regular exercises mostly outdoor and at home) - Family history of DM2 (Father) Denies hypoglycemia   Mild Elevated LDL - Reports no concerns. Last lipid panel 04/2023 normal - Currently taking Fish Oil omega 3 1g daily - No known history of CAD, MI, CVA  - She has been taking ASA 81mg  daily since last visit, doing well without bleeding or other episodes - She has never been on Statin medicine, has declined this in the past   Weakness in Lower Extremity No significant swelling When walking will get heaviness and tired legs Improved with rest.   Seasonal Allergies   Rheumatoid Arthritis Bilateral hands. Chronic problem. No new worsening or flare or joint pain.\     Health Maintenance:   Updated Shingrix # 2   Due Prevnar-20 today   Fam history of breast cancer - sisters and aunts. Next mammogram due 03/2023, she did one 03/2022 negative   Due for Colon CA Surveillance, asking to  return to GI. Last done Colonoscopy 2018 Dr Evette Cristal. Now retired.     05/09/2023    2:58 PM 03/15/2023    3:01 PM 11/02/2022   11:23 AM  Depression screen PHQ 2/9  Decreased Interest 0 0 0  Down, Depressed, Hopeless 0 0 0  PHQ - 2 Score 0 0 0  Altered sleeping 0 0 0  Tired, decreased energy 0 0 0  Change in appetite 0 0 0  Feeling bad or failure about yourself  0 0 0  Trouble concentrating 0 0 0  Moving slowly or fidgety/restless 0 0 0  Suicidal thoughts 0 0 0  PHQ-9 Score 0 0 0  Difficult doing work/chores Not difficult at all Not difficult at all Not difficult at all    Past Medical History:  Diagnosis Date   Arthritis    Family history of malignant neoplasm of breast    Family history of malignant neoplasm of gastrointestinal tract    History of colon cancer    Personal history of malignant neoplasm of large intestine    Special screening for malignant neoplasms, colon    Thyroid disease    Past Surgical History:  Procedure Laterality Date   ABDOMINAL HYSTERECTOMY  1983   COLON SURGERY  1990   resection d/t cancer   COLONOSCOPY  1997,2001,2004,2006, 2011   COLONOSCOPY WITH PROPOFOL N/A 10/24/2017   Procedure: COLONOSCOPY WITH PROPOFOL;  Surgeon: Kieth Brightly, MD;  Location: [facility];  Service: Endoscopy;  Laterality:  N/A;   EYE SURGERY  Sept 2013   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   SALPINGOOPHORECTOMY     Social History   Socioeconomic History   Marital status: Married    Spouse name: Ladeana Gervais   Number of children: 2   Years of education: Not on file   Highest education level: High school graduate  Occupational History   Occupation: Retired  Tobacco Use   Smoking status: Never   Smokeless tobacco: Never  Vaping Use   Vaping Use: Never used  Substance and Sexual Activity   Alcohol use: No    Alcohol/week: 0.0 standard drinks of alcohol   Drug use: No   Sexual activity: Not on file  Other Topics Concern   Not on  file  Social History Narrative   Gym one to 2 days  Week    Social Determinants of Health   Financial Resource Strain: Low Risk  (03/15/2023)   Overall Financial Resource Strain (CARDIA)    Difficulty of Paying Living Expenses: Not hard at all  Food Insecurity: No Food Insecurity (03/15/2023)   Hunger Vital Sign    Worried About Running Out of Food in the Last Year: Never true    Ran Out of Food in the Last Year: Never true  Transportation Needs: No Transportation Needs (03/15/2023)   PRAPARE - Administrator, Civil Service (Medical): No    Lack of Transportation (Non-Medical): No  Physical Activity: Sufficiently Active (03/15/2023)   Exercise Vital Sign    Days of Exercise per Week: 3 days    Minutes of Exercise per Session: 60 min  Stress: No Stress Concern Present (03/15/2023)   [employer] of Occupational Health - Occupational Stress Questionnaire    Feeling of Stress : Not at all  Social Connections: Socially Integrated (03/15/2023)   Social Connection and Isolation Panel [NHANES]    Frequency of Communication with Friends and Family: More than three times a week    Frequency of Social Gatherings with Friends and Family: Three times a week    Attends Religious Services: More than 4 times per year    Active Member of Clubs or Organizations: Yes    Attends Banker Meetings: More than 4 times per year    Marital Status: Married  Catering manager Violence: Not At Risk (03/15/2023)   Humiliation, Afraid, Rape, and Kick questionnaire    Fear of Current or Ex-Partner: No    Emotionally Abused: No    Physically Abused: No    Sexually Abused: No   Family History  Problem Relation Age of Onset   Hypertension Mother    Hypertension Father    Diabetes Father    Cancer Other        FH of breast and colon cancer   Cancer Brother        colon   Cancer Sister        breast   Breast cancer Sister 77   Current Outpatient Medications on File Prior to Visit   Medication Sig   aspirin EC 81 MG tablet Take 1 tablet (81 mg total) by mouth daily.   fluticasone (FLONASE) 50 MCG/ACT nasal spray Place into both nostrils daily.   Glucosamine-Chondroitin 500-400 MG CAPS Take 2 capsules by mouth daily.   ipratropium (ATROVENT) 0.06 % nasal spray Place 2 sprays into both nostrils 4 (four) times daily as needed for rhinitis. For allergies and congestion as needed  levothyroxine (SYNTHROID) 50 MCG tablet TAKE 1 TABLET BY MOUTH DAILY  BEFORE BREAKFAST   Omega-3 Fatty Acids (FISH OIL) 1000 MG CAPS Take 1 capsule by mouth daily.    No current facility-administered medications on file prior to visit.    Review of Systems  Constitutional:  Negative for activity change, appetite change, chills, diaphoresis, fatigue and fever.  HENT:  Negative for congestion and hearing loss.   Eyes:  Negative for visual disturbance.  Respiratory:  Negative for cough, chest tightness, shortness of breath and wheezing.   Cardiovascular:  Negative for chest pain, palpitations and leg swelling.  Gastrointestinal:  Negative for abdominal pain, constipation, diarrhea, nausea and vomiting.  Genitourinary:  Negative for dysuria, frequency and hematuria.  Musculoskeletal:  Negative for arthralgias and neck pain.  Skin:  Negative for rash.  Neurological:  Negative for dizziness, weakness, light-headedness, numbness and headaches.  Hematological:  Negative for adenopathy.  Psychiatric/Behavioral:  Negative for behavioral problems, dysphoric mood and sleep disturbance.    Per HPI unless specifically indicated above     Objective:    BP 138/85 (BP Location: Left Arm, Cuff Size: Normal)   Pulse 78   Temp (!) 96.8 F (36 C) (Temporal)   Wt 122 lb (55.3 kg)   SpO2 99%   BMI 20.30 kg/m   Wt Readings from Last 3 Encounters:  05/09/23 122 lb (55.3 kg)  03/15/23 123 lb 12.8 oz (56.2 kg)  11/02/22 124 lb 9.6 oz (56.5 kg)    Physical Exam Vitals and nursing note reviewed.   Constitutional:      General: She is not in acute distress.    Appearance: She is well-developed. She is not diaphoretic.     Comments: Well-appearing, comfortable, cooperative  HENT:     Head: Normocephalic and atraumatic.  Eyes:     General:        Right eye: No discharge.        Left eye: No discharge.     Conjunctiva/sclera: Conjunctivae normal.     Pupils: Pupils are equal, round, and reactive to light.  Neck:     Thyroid: No thyromegaly.  Cardiovascular:     Rate and Rhythm: Normal rate and regular rhythm.     Pulses: Normal pulses.     Heart sounds: Normal heart sounds. No murmur heard. Pulmonary:     Effort: Pulmonary effort is normal. No respiratory distress.     Breath sounds: Normal breath sounds. No wheezing or rales.  Abdominal:     General: Bowel sounds are normal. There is no distension.     Palpations: Abdomen is soft. There is no mass.     Tenderness: There is no abdominal tenderness.  Musculoskeletal:        General: No tenderness. Normal range of motion.     Cervical back: Normal range of motion and neck supple.     Comments: Upper / Lower Extremities: - Normal muscle tone, strength bilateral upper extremities 5/5, lower extremities 5/5  Lymphadenopathy:     Cervical: No cervical adenopathy.  Skin:    General: Skin is warm and dry.     Findings: No erythema or rash.  Neurological:     Mental Status: She is alert and oriented to person, place, and time.     Comments: Distal sensation intact to light touch all extremities  Psychiatric:        Mood and Affect: Mood normal.        Behavior: Behavior normal.  Thought Content: Thought content normal.     Comments: Well groomed, good eye contact, normal speech and thoughts     I have personally reviewed the radiology report from Mammogram  Narrative & Impression  CLINICAL DATA:  Screening.   EXAM: DIGITAL SCREENING BILATERAL MAMMOGRAM WITH TOMOSYNTHESIS AND CAD   TECHNIQUE: Bilateral  screening digital craniocaudal and mediolateral oblique mammograms were obtained. Bilateral screening digital breast tomosynthesis was performed. The images were evaluated with computer-aided detection.   COMPARISON:  Previous exam(s).   ACR Breast Density Category b: There are scattered areas of fibroglandular density.   FINDINGS: There are no findings suspicious for malignancy.   IMPRESSION: No mammographic evidence of malignancy. A result letter of this screening mammogram will be mailed directly to the patient.   RECOMMENDATION: Screening mammogram in one year. (Code:SM-B-01Y)   BI-RADS CATEGORY  1: Negative.     Electronically Signed   By: Amie Portland M.D.   On: 04/13/2023 14:11    Results for orders placed or performed in visit on 04/24/23  T4, free  Result Value Ref Range   Free T4 1.2 0.8 - 1.8 ng/dL  TSH  Result Value Ref Range   TSH 2.25 0.40 - 4.50 mIU/L  Lipid panel  Result Value Ref Range   Cholesterol 186 <200 mg/dL   HDL 72 > OR = 50 mg/dL   Triglycerides 64 <324 mg/dL   LDL Cholesterol (Calc) 99 mg/dL (calc)   Total CHOL/HDL Ratio 2.6 <5.0 (calc)   Non-HDL Cholesterol (Calc) 114 <130 mg/dL (calc)  COMPLETE METABOLIC PANEL WITH GFR  Result Value Ref Range   Glucose, Bld 84 65 - 99 mg/dL   BUN 23 7 - 25 mg/dL   Creat 4.01 (H) 0.27 - 0.95 mg/dL   eGFR 56 (L) > OR = 60 mL/min/1.27m2   BUN/Creatinine Ratio 24 (H) 6 - 22 (calc)   Sodium 138 135 - 146 mmol/L   Potassium 4.2 3.5 - 5.3 mmol/L   Chloride 101 98 - 110 mmol/L   CO2 27 20 - 32 mmol/L   Calcium 9.1 8.6 - 10.4 mg/dL   Total Protein 6.3 6.1 - 8.1 g/dL   Albumin 4.1 3.6 - 5.1 g/dL   Globulin 2.2 1.9 - 3.7 g/dL (calc)   AG Ratio 1.9 1.0 - 2.5 (calc)   Total Bilirubin 0.9 0.2 - 1.2 mg/dL   Alkaline phosphatase (APISO) 48 37 - 153 U/L   AST 18 10 - 35 U/L   ALT 8 6 - 29 U/L  CBC with Differential/Platelet  Result Value Ref Range   WBC 4.5 3.8 - 10.8 Thousand/uL   RBC 4.50 3.80 - 5.10  Million/uL   Hemoglobin 13.1 11.7 - 15.5 g/dL   HCT 25.3 66.4 - 40.3 %   MCV 88.2 80.0 - 100.0 fL   MCH 29.1 27.0 - 33.0 pg   MCHC 33.0 32.0 - 36.0 g/dL   RDW 47.4 25.9 - 56.3 %   Platelets 178 140 - 400 Thousand/uL   MPV 10.7 7.5 - 12.5 fL   Neutro Abs 1,926 1,500 - 7,800 cells/uL   Lymphs Abs 1,818 850 - 3,900 cells/uL   Absolute Monocytes 545 200 - 950 cells/uL   Eosinophils Absolute 171 15 - 500 cells/uL   Basophils Absolute 41 0 - 200 cells/uL   Neutrophils Relative % 42.8 %   Total Lymphocyte 40.4 %   Monocytes Relative 12.1 %   Eosinophils Relative 3.8 %   Basophils Relative 0.9 %  Hemoglobin  A1c  Result Value Ref Range   Hgb A1c MFr Bld 5.6 <5.7 % of total Hgb   Mean Plasma Glucose 114 mg/dL   eAG (mmol/L) 6.3 mmol/L      Assessment & Plan:   Problem List Items Addressed This Visit     Hypothyroidism (Chronic)    Asymptomatic Stable, controlled Last lab TSH normal. Continue Levothyroxine daily      Elevated hemoglobin A1c    Well-controlled Pre-DM with A1c 5.6, stable, under range of PreDM Concern with HTN  Plan:  1. Not on any therapy currently  2. Encourage improved lifestyle - low carb, low sugar diet - given BMI 20 and prior wt loss, encouraged to continue higher protein diet, should not limit calories based on concern for Pre-DM. Continue regular exercise  Check yearly      Elevated LDL cholesterol level    Controlled on lifestyle, fish oil Continue ASA Not on statin therapy, declines.      Essential hypertension    Elevated initial BP, repeat manual improved Multiple occasions her home blood pressure cuff has been calibrated Home readings available, occasional elevated  Stable most consistent with white coat HTN No other known complication. History of hypotension / syncope Failed Amlodipine (swelling). Discontinued Triamterene-HCTZ in past.  Plan:  Last dose Lisinopril half tab tonight START  new med Valsartan 80mg  daily in  morning. Keep track of BP. If readings are elevated >140/90, we can adjust increase dose to 160mg  dose. You can also double it and see how you do. Let me know before 30 days how it goes and we can re order 90 day to Mail Order.  2. Encourage improved lifestyle - continue low sodium diet, regular exercise 3. Continue close monitor BP outside office weekly, bring reading      Relevant Medications   valsartan (DIOVAN) 80 MG tablet   Other Visit Diagnoses     Annual physical exam    -  Primary   Bilateral leg weakness       Relevant Orders   Ambulatory referral to Vascular Surgery   Venous insufficiency of both lower extremities       Relevant Medications   valsartan (DIOVAN) 80 MG tablet   Other Relevant Orders   Ambulatory referral to Vascular Surgery       Updated Health Maintenance information Reviewed recent lab results with patient Encouraged improvement to lifestyle with diet and exercise Goal to maintain weight  referral to Vascular for evaluation with abnormal initial ABI screening by home health, she has historical symptoms of bilateral lower ext circulation concerns with possible arterial insufficiency with weakness heaviness in legs but not painful. She has venous insufficiency and varicose veins as well.   Orders Placed This Encounter  Procedures   Ambulatory referral to Vascular Surgery    Referral Priority:   Routine    Referral Type:   Surgical    Referral Reason:   Specialty Services Required    Requested Specialty:   Vascular Surgery    Number of Visits Requested:   1     Meds ordered this encounter  Medications   valsartan (DIOVAN) 80 MG tablet    Sig: Take 1 tablet (80 mg total) by mouth daily.    Dispense:  30 tablet    Refill:  0    Stop Lisinopril, switch to Valsartan 80mg      Follow up plan: Return in about 3 months (around 08/09/2023) for 3 month follow-up HTN, Vascular updates.Marland Kitchen  Saralyn Pilar, DO [facility] 05/09/2023, 1:47 PM

## 2023-05-09 NOTE — Patient Instructions (Addendum)
Thank you for coming to the office today.  Last dose Lisinopril half tab tonight START  new med Valsartan 80mg  daily in morning.  Keep track of BP. If readings are elevated >140/90, we can adjust increase dose to 160mg  dose. You can also double it and see how you do.  Let me know before 30 days how it goes and we can re order 90 day to Mail Order.  VASCULAR SURGERY  [facility] [address] [place],  [place]  [address] Main: [phone]  Please schedule a Follow-up Appointment to: Return in about 3 months (around 08/09/2023) for 3 month follow-up HTN, Vascular updates..  If you have any other questions or concerns, please feel free to call the office or send a message through MyChart. You may also schedule an earlier appointment if necessary.  Additionally, you may be receiving a survey about your experience at our office within a few days to 1 week by e-mail or mail. We value your feedback.  Saralyn Pilar, DO [facility], [place]

## 2023-05-10 ENCOUNTER — Encounter: Payer: Medicare Other | Admitting: Family Medicine

## 2023-05-10 ENCOUNTER — Encounter: Payer: Self-pay | Admitting: Family Medicine

## 2023-05-10 NOTE — Assessment & Plan Note (Signed)
Controlled on lifestyle, fish oil Continue ASA Not on statin therapy, declines. 

## 2023-05-10 NOTE — Assessment & Plan Note (Signed)
Elevated initial BP, repeat manual improved Multiple occasions her home blood pressure cuff has been calibrated Home readings available, occasional elevated  Stable most consistent with white coat HTN No other known complication. History of hypotension / syncope Failed Amlodipine (swelling). Discontinued Triamterene-HCTZ in past.  Plan:  Last dose Lisinopril half tab tonight START  new med Valsartan 80mg  daily in morning. Keep track of BP. If readings are elevated >140/90, we can adjust increase dose to 160mg  dose. You can also double it and see how you do. Let me know before 30 days how it goes and we can re order 90 day to Mail Order.  2. Encourage improved lifestyle - continue low sodium diet, regular exercise 3. Continue close monitor BP outside office weekly, bring reading

## 2023-05-10 NOTE — Assessment & Plan Note (Signed)
Well-controlled Pre-DM with A1c 5.6, stable, under range of PreDM Concern with HTN  Plan:  1. Not on any therapy currently  2. Encourage improved lifestyle - low carb, low sugar diet - given BMI 20 and prior wt loss, encouraged to continue higher protein diet, should not limit calories based on concern for Pre-DM. Continue regular exercise  Check yearly

## 2023-05-10 NOTE — Assessment & Plan Note (Signed)
Asymptomatic Stable, controlled Last lab TSH normal. Continue Levothyroxine 50mcg daily 

## 2023-05-23 ENCOUNTER — Telehealth: Payer: Self-pay | Admitting: Family Medicine

## 2023-05-23 DIAGNOSIS — I10 Essential (primary) hypertension: Secondary | ICD-10-CM

## 2023-05-23 MED ORDER — VALSARTAN 80 MG PO TABS: 80.0000 mg | ORAL_TABLET | Freq: Every day | ORAL | 3 refills | Status: DC

## 2023-05-23 NOTE — Telephone Encounter (Signed)
Please notify patient regarding her BP readings  Patient seen earlier in June. She kept log of BP after med change from Lisinopril to Valsartan 80mg   Her BP readings appear overall better, occasional elevated reading that repeat shows improved.  Okay to continue taking Valsartan 80mg  daily.  I will send new rx Valsartan 80mg  90 day to [facility]  Saralyn Pilar, DO [facility] 05/23/2023, 5:20 PM

## 2023-05-24 NOTE — Telephone Encounter (Signed)
The pt was notified of the providers recommendation. She verbalizes understanding.

## 2023-06-29 ENCOUNTER — Other Ambulatory Visit (INDEPENDENT_AMBULATORY_CARE_PROVIDER_SITE_OTHER): Payer: Self-pay | Admitting: Nurse Practitioner

## 2023-06-29 DIAGNOSIS — R29898 Other symptoms and signs involving the musculoskeletal system: Secondary | ICD-10-CM

## 2023-07-03 ENCOUNTER — Ambulatory Visit (INDEPENDENT_AMBULATORY_CARE_PROVIDER_SITE_OTHER): Payer: Medicare Other

## 2023-07-03 ENCOUNTER — Encounter (INDEPENDENT_AMBULATORY_CARE_PROVIDER_SITE_OTHER): Payer: Self-pay | Admitting: Vascular Surgery

## 2023-07-03 ENCOUNTER — Ambulatory Visit (INDEPENDENT_AMBULATORY_CARE_PROVIDER_SITE_OTHER): Payer: Medicare Other | Admitting: Vascular Surgery

## 2023-07-03 VITALS — BP 196/104 | HR 78 | Resp 18 | Ht 66.0 in | Wt 123.6 lb

## 2023-07-03 DIAGNOSIS — R29898 Other symptoms and signs involving the musculoskeletal system: Secondary | ICD-10-CM | POA: Diagnosis not present

## 2023-07-03 DIAGNOSIS — I872 Venous insufficiency (chronic) (peripheral): Secondary | ICD-10-CM

## 2023-07-03 DIAGNOSIS — I739 Peripheral vascular disease, unspecified: Secondary | ICD-10-CM | POA: Diagnosis not present

## 2023-07-03 NOTE — Assessment & Plan Note (Signed)
Has physical changes consistent with venous insufficiency.  Recommend compression socks, elevation, and continuing to be active.  Symptoms are not severe enough to warrant any invasive treatment at this time.  Call our office if they worsen.

## 2023-07-03 NOTE — Assessment & Plan Note (Signed)
The patient had a home health screening study suggesting PAD.  No worrisome symptoms. Her ABIs today showed triphasic waveforms with a right ABI of 1.08 and the left ABI that was likely noncompressible for medial calcification.  Digit pressures are 142 on the right and 151 on the left.  Although she does have calcification, she does not have significant arterial insufficiency and does not need any further evaluation or intervention given her age and lack of symptoms.  I will see her back as needed.

## 2023-07-03 NOTE — Progress Notes (Signed)
Patient ID: Brittany Oneal, female   DOB: 1931/05/22, 89 y.o.   MRN: [id number]  Chief Complaint  Patient presents with   New Patient (Initial Visit)    Np consult with ABI see JD referral to Vascular for evaluation with abnormal initial ABI screening by home health, she has historical symptoms of bilateral lower ext circulation concerns with possible arterial insufficiency with weakness heaviness in legs but not painful. She has venous insufficiency and varicose veins as well.    HPI Brittany Oneal is a 89 y.o. female.  I am asked to see the patient by Dr. Althea Charon for evaluation of a home health screening study that suggested significant PAD.  She is 89 but remains very active.  She has had purplish discoloration and prominent varicosities in her feet and lower legs for many years.  She does have some varicose veins but has never had DVT or superficial thrombophlebitis to her knowledge.  She has occasional mild swelling but nothing that is particularly bothersome to her.  No nonhealing ulceration.  No ischemic rest pain.  Her ABIs today showed triphasic waveforms with a right ABI of 1.08 and the left ABI that was likely noncompressible for medial calcification.  Digit pressures are 142 on the right and 151 on the left..     Past Medical History:  Diagnosis Date   Arthritis    Family history of malignant neoplasm of breast    Family history of malignant neoplasm of gastrointestinal tract    History of colon cancer    Personal history of malignant neoplasm of large intestine    Special screening for malignant neoplasms, colon    Thyroid disease     Past Surgical History:  Procedure Laterality Date   ABDOMINAL HYSTERECTOMY  1983   COLON SURGERY  1990   resection d/t cancer   COLONOSCOPY  1997,2001,2004,2006, 2011   COLONOSCOPY WITH PROPOFOL N/A 10/24/2017   Procedure: COLONOSCOPY WITH PROPOFOL;  Surgeon: Kieth Brightly, MD;  Location: [facility] ENDOSCOPY;  Service: Endoscopy;   Laterality: N/A;   EYE SURGERY  Sept 2013   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   SALPINGOOPHORECTOMY       Family History  Problem Relation Age of Onset   Hypertension Mother    Hypertension Father    Diabetes Father    Cancer Other        FH of breast and colon cancer   Cancer Brother        colon   Cancer Sister        breast   Breast cancer Sister 48      Social History   Tobacco Use   Smoking status: Never   Smokeless tobacco: Never  Vaping Use   Vaping status: Never Used  Substance Use Topics   Alcohol use: No    Alcohol/week: 0.0 standard drinks of alcohol   Drug use: No     Allergies  Allergen Reactions   Contrast Media [Iodinated Contrast Media] Other (See Comments)    Reaction not listed   Sulfa Antibiotics Hives   Shellfish Allergy Rash    Current Outpatient Medications  Medication Sig Dispense Refill   aspirin EC 81 MG tablet Take 1 tablet (81 mg total) by mouth daily.     fluticasone (FLONASE) 50 MCG/ACT nasal spray Place into both nostrils daily.     Glucosamine-Chondroitin 500-400 MG CAPS Take 2 capsules by mouth daily.     ipratropium (  ATROVENT) 0.06 % nasal spray Place 2 sprays into both nostrils 4 (four) times daily as needed for rhinitis. For allergies and congestion as needed 15 mL 5   levothyroxine (SYNTHROID) 50 MCG tablet TAKE 1 TABLET BY MOUTH DAILY  BEFORE BREAKFAST 90 tablet 3   Omega-3 Fatty Acids (FISH OIL) 1000 MG CAPS Take 1 capsule by mouth daily.      valsartan (DIOVAN) 80 MG tablet Take 1 tablet (80 mg total) by mouth daily. 90 tablet 3   No current facility-administered medications for this visit.      REVIEW OF SYSTEMS (Negative unless checked)  Constitutional: [] Weight loss  [] Fever  [] Chills Cardiac: [] Chest pain   [] Chest pressure   [] Palpitations   [] Shortness of breath when laying flat   [] Shortness of breath at rest   [] Shortness of breath with exertion. Vascular:  [] Pain in legs with walking    [] Pain in legs at rest   [] Pain in legs when laying flat   [] Claudication   [] Pain in feet when walking  [] Pain in feet at rest  [] Pain in feet when laying flat   [] History of DVT   [] Phlebitis   [x] Swelling in legs   [x] Varicose veins   [] Non-healing ulcers Pulmonary:   [] Uses home oxygen   [] Productive cough   [] Hemoptysis   [] Wheeze  [] COPD   [] Asthma Neurologic:  [] Dizziness  [] Blackouts   [] Seizures   [] History of stroke   [] History of TIA  [] Aphasia   [] Temporary blindness   [] Dysphagia   [] Weakness or numbness in arms   [] Weakness or numbness in legs Musculoskeletal:  [x] Arthritis   [] Joint swelling   [x] Joint pain   [] Low back pain Hematologic:  [] Easy bruising  [] Easy bleeding   [] Hypercoagulable state   [] Anemic  [] Hepatitis Gastrointestinal:  [] Blood in stool   [] Vomiting blood  [] Gastroesophageal reflux/heartburn   [] Abdominal pain Genitourinary:  [] Chronic kidney disease   [] Difficult urination  [] Frequent urination  [] Burning with urination   [] Hematuria Skin:  [] Rashes   [] Ulcers   [] Wounds Psychological:  [] History of anxiety   []  History of major depression.    Physical Exam BP (!) 196/104 (BP Location: Left Arm)   Pulse 78   Resp 18   Ht 5\' 6"  (1.676 m)   Wt 123 lb 9.6 oz (56.1 kg)   BMI 19.95 kg/m  Gen:  WD/WN, NAD. Appears much younger than stated age. Head: [place]/AT, No temporalis wasting.  Ear/Nose/Throat: Hearing grossly intact, nares w/o erythema or drainage, oropharynx w/o Erythema/Exudate Eyes: Conjunctiva clear, sclera non-icteric  Neck: trachea midline.  No JVD.  Pulmonary:  Good air movement, respirations not labored, no use of accessory muscles  Cardiac: RRR, no JVD Vascular:  Vessel Right Left  Radial Palpable Palpable                                   Gastrointestinal:. No masses, surgical incisions, or scars. Musculoskeletal: M/S 5/5 throughout.  Extremities without ischemic changes.  No deformity or atrophy. Venous stasis changes in both lower  legs with purplish discoloration and prominent varicosities. Trace LE edema. Neurologic: Sensation grossly intact in extremities.  Symmetrical.  Speech is fluent. Motor exam as listed above. Psychiatric: Judgment intact, Mood & affect appropriate for pt's clinical situation. Dermatologic: No rashes or ulcers noted.  No cellulitis or open wounds.    Radiology No results found.  Labs Recent Results (from the past 2160 hour(s))  T4, free     Status: None   Collection Time: 04/24/23  9:27 AM  Result Value Ref Range   Free T4 1.2 0.8 - 1.8 ng/dL  TSH     Status: None   Collection Time: 04/24/23  9:27 AM  Result Value Ref Range   TSH 2.25 0.40 - 4.50 mIU/L  Lipid panel     Status: None   Collection Time: 04/24/23  9:27 AM  Result Value Ref Range   Cholesterol 186 <200 mg/dL   HDL 72 > OR = 50 mg/dL   Triglycerides 64 <696 mg/dL   LDL Cholesterol (Calc) 99 mg/dL (calc)    Comment: Reference range: <100 . Desirable range <100 mg/dL for primary prevention;   <70 mg/dL for patients with CHD or diabetic patients  with > or = 2 CHD risk factors. Marland Kitchen LDL-C is now calculated using the Martin-Hopkins  calculation, which is a validated novel method providing  better accuracy than the Friedewald equation in the  estimation of LDL-C.  Horald Pollen et al. Lenox Ahr. 2952;841(32): 2061-2068  (http://education.[facility].com/faq/FAQ164)    Total CHOL/HDL Ratio 2.6 <5.0 (calc)   Non-HDL Cholesterol (Calc) 114 <130 mg/dL (calc)    Comment: For patients with diabetes plus 1 major ASCVD risk  factor, treating to a non-HDL-C goal of <100 mg/dL  (LDL-C of <44 mg/dL) is considered a therapeutic  option.   COMPLETE METABOLIC PANEL WITH GFR     Status: Abnormal   Collection Time: 04/24/23  9:27 AM  Result Value Ref Range   Glucose, Bld 84 65 - 99 mg/dL    Comment: .            Fasting reference interval .    BUN 23 7 - 25 mg/dL   Creat 0.10 (H) 2.72 - 0.95 mg/dL   eGFR 56 (L) > OR = 60  mL/min/1.48m2   BUN/Creatinine Ratio 24 (H) 6 - 22 (calc)   Sodium 138 135 - 146 mmol/L   Potassium 4.2 3.5 - 5.3 mmol/L   Chloride 101 98 - 110 mmol/L   CO2 27 20 - 32 mmol/L   Calcium 9.1 8.6 - 10.4 mg/dL   Total Protein 6.3 6.1 - 8.1 g/dL   Albumin 4.1 3.6 - 5.1 g/dL   Globulin 2.2 1.9 - 3.7 g/dL (calc)   AG Ratio 1.9 1.0 - 2.5 (calc)   Total Bilirubin 0.9 0.2 - 1.2 mg/dL   Alkaline phosphatase (APISO) 48 37 - 153 U/L   AST 18 10 - 35 U/L   ALT 8 6 - 29 U/L  CBC with Differential/Platelet     Status: None   Collection Time: 04/24/23  9:27 AM  Result Value Ref Range   WBC 4.5 3.8 - 10.8 Thousand/uL   RBC 4.50 3.80 - 5.10 Million/uL   Hemoglobin 13.1 11.7 - 15.5 g/dL   HCT 53.6 64.4 - 03.4 %   MCV 88.2 80.0 - 100.0 fL   MCH 29.1 27.0 - 33.0 pg   MCHC 33.0 32.0 - 36.0 g/dL   RDW 74.2 59.5 - 63.8 %   Platelets 178 140 - 400 Thousand/uL   MPV 10.7 7.5 - 12.5 fL   Neutro Abs 1,926 1,500 - 7,800 cells/uL   Lymphs Abs 1,818 850 - 3,900 cells/uL   Absolute Monocytes 545 200 - 950 cells/uL   Eosinophils Absolute 171 15 - 500 cells/uL   Basophils Absolute 41 0 - 200 cells/uL   Neutrophils Relative % 42.8 %   Total Lymphocyte  40.4 %   Monocytes Relative 12.1 %   Eosinophils Relative 3.8 %   Basophils Relative 0.9 %  Hemoglobin A1c     Status: None   Collection Time: 04/24/23  9:27 AM  Result Value Ref Range   Hgb A1c MFr Bld 5.6 <5.7 % of total Hgb    Comment: For the purpose of screening for the presence of diabetes: . <5.7%       Consistent with the absence of diabetes 5.7-6.4%    Consistent with increased risk for diabetes             (prediabetes) > or =6.5%  Consistent with diabetes . This assay result is consistent with a decreased risk of diabetes. . Currently, no consensus exists regarding use of hemoglobin A1c for diagnosis of diabetes in children. . According to American Diabetes Association (ADA) guidelines, hemoglobin A1c <7.0% represents optimal control  in non-pregnant diabetic patients. Different metrics may apply to specific patient populations.  Standards of Medical Care in Diabetes(ADA). .    Mean Plasma Glucose 114 mg/dL   eAG (mmol/L) 6.3 mmol/L    Comment: . This test was performed on the Roche cobas c503 platform. Effective 09/04/22, a change in test platforms from the Abbott Architect to the Roche cobas c503 may have shifted HbA1c results compared to historical results. Based on laboratory validation testing conducted at [facility], the Roche platform relative to the Abbott platform had an average increase in HbA1c value of < or = 0.3%. This difference is within accepted  variability established by the [facility]. Note that not all individuals will have had a shift in their results and direct comparisons between historical and current results for testing conducted on different platforms is not recommended.     Assessment/Plan:  PAD (peripheral artery disease) (HCC) The patient had a home health screening study suggesting PAD.  No worrisome symptoms. Her ABIs today showed triphasic waveforms with a right ABI of 1.08 and the left ABI that was likely noncompressible for medial calcification.  Digit pressures are 142 on the right and 151 on the left.  Although she does have calcification, she does not have significant arterial insufficiency and does not need any further evaluation or intervention given her age and lack of symptoms.  I will see her back as needed.    Chronic venous insufficiency Has physical changes consistent with venous insufficiency.  Recommend compression socks, elevation, and continuing to be active.  Symptoms are not severe enough to warrant any invasive treatment at this time.  Call our office if they worsen.      Festus Barren 07/03/2023, 4:49 PM   This note was created with Dragon medical transcription system.  Any errors from dictation are unintentional.

## 2023-08-14 ENCOUNTER — Other Ambulatory Visit: Payer: Self-pay | Admitting: Family Medicine

## 2023-08-14 ENCOUNTER — Encounter: Payer: Self-pay | Admitting: Family Medicine

## 2023-08-14 ENCOUNTER — Ambulatory Visit: Payer: Medicare Other | Admitting: Family Medicine

## 2023-08-14 VITALS — BP 142/80 | HR 80 | Ht 66.0 in | Wt 125.0 lb

## 2023-08-14 DIAGNOSIS — E039 Hypothyroidism, unspecified: Secondary | ICD-10-CM

## 2023-08-14 DIAGNOSIS — I872 Venous insufficiency (chronic) (peripheral): Secondary | ICD-10-CM

## 2023-08-14 DIAGNOSIS — R7309 Other abnormal glucose: Secondary | ICD-10-CM

## 2023-08-14 DIAGNOSIS — I739 Peripheral vascular disease, unspecified: Secondary | ICD-10-CM | POA: Diagnosis not present

## 2023-08-14 DIAGNOSIS — Z23 Encounter for immunization: Secondary | ICD-10-CM | POA: Diagnosis not present

## 2023-08-14 DIAGNOSIS — I10 Essential (primary) hypertension: Secondary | ICD-10-CM

## 2023-08-14 DIAGNOSIS — E7800 Pure hypercholesterolemia, unspecified: Secondary | ICD-10-CM

## 2023-08-14 DIAGNOSIS — Z0000 Encounter for general adult medical examination without abnormal findings: Secondary | ICD-10-CM

## 2023-08-14 NOTE — Patient Instructions (Addendum)
Thank you for coming to the office today.  Vascular doctor said that the artery circulation is not a problem and mostly normal  Veins do have venous insufficiency and can cause swelling  BP reading to me is in 140s, I am okay with this.  Keep on current med, refills are available.  Recommend check BP at home 2 x a month, sooner.  DUE for FASTING BLOOD WORK (no food or drink after midnight before the lab appointment, only water or coffee without cream/sugar on the morning of)  SCHEDULE "Lab Only" visit in the morning at the clinic for lab draw in 8 MONTHS   - Make sure Lab Only appointment is at about 1 week before your next appointment, so that results will be available  For Lab Results, once available within 2-3 days of blood draw, you can can log in to MyChart online to view your results and a brief explanation. Also, we can discuss results at next follow-up visit.     Please schedule a Follow-up Appointment to: Return in about 8 months (around 04/12/2024) for 8 month fasting lab only then 1 week later Annual Physical (late morning or early afternoon).  If you have any other questions or concerns, please feel free to call the office or send a message through MyChart. You may also schedule an earlier appointment if necessary.  Additionally, you may be receiving a survey about your experience at our office within a few days to 1 week by e-mail or mail. We value your feedback.  Saralyn Pilar, DO [facility], [place]

## 2023-08-14 NOTE — Progress Notes (Signed)
Subjective:    Patient ID: Brittany Oneal, female    DOB: 02/21/1931, 89 y.o.   MRN: [id number]  Brittany Oneal is a 89 y.o. female presenting on 08/14/2023 for Hypertension   HPI  CHRONIC HTN: Elevated BP here in office Home BP readings 120-140 mostly 130s Lab showed mostly normal creatinine, slightly increased but overall not a concern Current Meds - Valsartan 80mg  daily Reports good compliance, took meds today. Tolerating well, w/o complaints - Taking ASA 81mg  daily tolerating well    Hypothyroidism, chronic Last lab normal TSH Stable chronic problem, still taking Levothyroxine daily She denies any symptoms of low thyroid hormone at this time   Elevated A1c Last A1c 5.6, slightly elevated from 5.4 No new concerns or updates. Meds: Never on medication for blood sugar Currently on ACEi Lifestyle: - Weight is stable - Diet (Still focusing on healthy diet, avoiding sweets, improving appetite, drinks mostly water, some tea, coffee)  - Exercise (Regular exercises mostly outdoor and at home) - Family history of DM2 (Father) Denies hypoglycemia   Mild Elevated LDL - Reports no concerns. Last lipid panel 04/2023 normal - Currently taking Fish Oil omega 3 1g daily - No known history of CAD, MI, CVA  - She has been taking ASA 81mg  daily since last visit, doing well without bleeding or other episodes - She has never been on Statin medicine, has declined this in the past   PAD, mild Venous Insufficiency She had recent vascular evaluation Dr Wyn Quaker [facility] 07/03/23, had abnormal PAD screening at home test, and then we pursued the eval at Vascular. They did ABIs and determined mild calcification of arteries lower extremity but not significant for PAD that requires intervention. No significant symptoms. They did identify Chronic Venous Insufficiency and symptoms. She can use RICE therapy compression elevation to manage it. No further treatment required.   Health Maintenance: Due for Flu  Shot today     05/09/2023    2:58 PM 03/15/2023    3:01 PM 11/02/2022   11:23 AM  Depression screen PHQ 2/9  Decreased Interest 0 0 0  Down, Depressed, Hopeless 0 0 0  PHQ - 2 Score 0 0 0  Altered sleeping 0 0 0  Tired, decreased energy 0 0 0  Change in appetite 0 0 0  Feeling bad or failure about yourself  0 0 0  Trouble concentrating 0 0 0  Moving slowly or fidgety/restless 0 0 0  Suicidal thoughts 0 0 0  PHQ-9 Score 0 0 0  Difficult doing work/chores Not difficult at all Not difficult at all Not difficult at all    Social History   Tobacco Use   Smoking status: Never   Smokeless tobacco: Never  Vaping Use   Vaping status: Never Used  Substance Use Topics   Alcohol use: No    Alcohol/week: 0.0 standard drinks of alcohol   Drug use: No    Review of Systems Per HPI unless specifically indicated above     Objective:    BP (!) 142/80 (BP Location: Left Arm, Cuff Size: Normal)   Pulse 80   Ht 5\' 6"  (1.676 m)   Wt 125 lb (56.7 kg)   SpO2 99%   BMI 20.18 kg/m   Wt Readings from Last 3 Encounters:  08/14/23 125 lb (56.7 kg)  07/03/23 123 lb 9.6 oz (56.1 kg)  05/09/23 122 lb (55.3 kg)    Physical Exam Vitals and nursing note reviewed.  Constitutional:  General: She is not in acute distress.    Appearance: She is well-developed. She is not diaphoretic.     Comments: Well-appearing, comfortable, cooperative  HENT:     Head: Normocephalic and atraumatic.  Eyes:     General:        Right eye: No discharge.        Left eye: No discharge.     Conjunctiva/sclera: Conjunctivae normal.  Neck:     Thyroid: No thyromegaly.  Cardiovascular:     Rate and Rhythm: Normal rate and regular rhythm.     Heart sounds: Normal heart sounds. No murmur heard. Pulmonary:     Effort: Pulmonary effort is normal. No respiratory distress.     Breath sounds: Normal breath sounds. No wheezing or rales.  Musculoskeletal:        General: Normal range of motion.     Cervical back:  Normal range of motion and neck supple.  Lymphadenopathy:     Cervical: No cervical adenopathy.  Skin:    General: Skin is warm and dry.     Findings: No erythema or rash.  Neurological:     Mental Status: She is alert and oriented to person, place, and time.  Psychiatric:        Behavior: Behavior normal.     Comments: Well groomed, good eye contact, normal speech and thoughts    Results for orders placed or performed in visit on 07/03/23  VAS [place] ABI WITH/WO TBI  Result Value Ref Range   Right ABI 1.08    Left ABI Non compressible       Assessment & Plan:   Problem List Items Addressed This Visit     Chronic venous insufficiency   Essential hypertension - Primary    Elevated initial BP, repeat manual improved Multiple occasions her home blood pressure cuff has been calibrated Home readings available, occasional elevated  Stable most consistent with white coat HTN No other known complication. History of hypotension / syncope Failed Amlodipine (swelling). Discontinued Triamterene-HCTZ in past.  Plan:  Continue Valsartan 80mg  daily Keep track of BP. If readings are elevated >140/90, we can adjust increase dose to 160mg  dose.   2. Encourage improved lifestyle - continue low sodium diet, regular exercise 3. Continue close monitor BP outside office weekly, bring reading      PAD (peripheral artery disease) (HCC)   White coat syndrome with diagnosis of hypertension   Other Visit Diagnoses     Need for influenza vaccination       Relevant Orders   Flu Vaccine Trivalent High Dose (Fluad) (Completed)      Orders Placed This Encounter  Procedures   Flu Vaccine Trivalent High Dose (Fluad)   Assessment and Plan    Venous insufficiency Mild Peripheral Artery Disease Evaluation done by Vascular, ABIs No significant PAD warranting therapy Conservative therapy RICE for Venous insufficiency  General Health Maintenance -Administer influenza vaccine today. -Schedule  fasting lab and physical check in eight months (May 2025). -Encourage patient to maintain diet and activity for blood sugar control. No need for medication at this time (A1c 5.6 in May 2024).         No orders of the defined types were placed in this encounter.   Follow up plan: Return in about 8 months (around 04/12/2024) for 8 month fasting lab only then 1 week later Annual Physical (late morning or early afternoon).  Future labs ordered for 04/14/24   Saralyn Pilar, DO [facility]  [facility] 08/14/2023, 10:56 AM

## 2023-08-14 NOTE — Assessment & Plan Note (Signed)
Elevated initial BP, repeat manual improved Multiple occasions her home blood pressure cuff has been calibrated Home readings available, occasional elevated  Stable most consistent with white coat HTN No other known complication. History of hypotension / syncope Failed Amlodipine (swelling). Discontinued Triamterene-HCTZ in past.  Plan:  Continue Valsartan 80mg  daily Keep track of BP. If readings are elevated >140/90, we can adjust increase dose to 160mg  dose.   2. Encourage improved lifestyle - continue low sodium diet, regular exercise 3. Continue close monitor BP outside office weekly, bring reading

## 2024-02-19 ENCOUNTER — Other Ambulatory Visit: Payer: Self-pay | Admitting: Family Medicine

## 2024-02-19 DIAGNOSIS — E039 Hypothyroidism, unspecified: Secondary | ICD-10-CM

## 2024-02-21 NOTE — Telephone Encounter (Signed)
 Requested Prescriptions  Pending Prescriptions Disp Refills   levothyroxine (SYNTHROID) 50 MCG tablet [Pharmacy Med Name: Levothyroxine Sodium 50 MCG Oral Tablet] 90 tablet 0    Sig: TAKE 1 TABLET BY MOUTH DAILY  BEFORE BREAKFAST     Endocrinology:  Hypothyroid Agents Failed - 02/21/2024 12:17 PM      Failed - Valid encounter within last 12 months    Recent Outpatient Visits   None     Future Appointments             In 2 months Althea Charon, Netta Neat, DO [place] [facility], [facility]            Passed - TSH in normal range and within 360 days    TSH  Date Value Ref Range Status  04/24/2023 2.25 0.40 - 4.50 mIU/L Final

## 2024-03-20 ENCOUNTER — Telehealth: Payer: Self-pay | Admitting: Family Medicine

## 2024-03-20 NOTE — Telephone Encounter (Signed)
 LVM 03/20/24 to r/s AWV due NHA not in office on Thursdays khc   Brittany Oneal; Care Guide Ambulatory Clinical Support [facility] Direct Dial: [phone]

## 2024-03-27 ENCOUNTER — Other Ambulatory Visit: Payer: Self-pay | Admitting: Family Medicine

## 2024-03-27 DIAGNOSIS — Z1231 Encounter for screening mammogram for malignant neoplasm of breast: Secondary | ICD-10-CM

## 2024-03-30 ENCOUNTER — Other Ambulatory Visit: Payer: Self-pay | Admitting: Family Medicine

## 2024-03-30 DIAGNOSIS — I10 Essential (primary) hypertension: Secondary | ICD-10-CM

## 2024-04-01 NOTE — Telephone Encounter (Signed)
 Rx 05/23/23 #90 3RF- 1 year Requested Prescriptions  Pending Prescriptions Disp Refills   valsartan  (DIOVAN ) 80 MG tablet [Pharmacy Med Name: Valsartan  80 MG Oral Tablet] 90 tablet 3    Sig: TAKE 1 TABLET BY MOUTH DAILY     Cardiovascular:  Angiotensin Receptor Blockers Failed - 04/01/2024  2:41 PM      Failed - Cr in normal range and within 180 days    Creat  Date Value Ref Range Status  04/24/2023 0.97 (H) 0.60 - 0.95 mg/dL Final         Failed - K in normal range and within 180 days    Potassium  Date Value Ref Range Status  04/24/2023 4.2 3.5 - 5.3 mmol/L Final  03/09/2015 4.5 mmol/L Final    Comment:    3.5-5.1 NOTE: New Reference Range  02/02/15          Failed - Last BP in normal range    BP Readings from Last 1 Encounters:  08/14/23 (!) 142/80         Failed - Valid encounter within last 6 months    Recent Outpatient Visits   None     Future Appointments             In 3 weeks Romeo Co, Kayleen Party, DO [place], [facility]            Passed - Patient is not pregnant

## 2024-04-14 ENCOUNTER — Other Ambulatory Visit: Payer: Self-pay

## 2024-04-14 DIAGNOSIS — I739 Peripheral vascular disease, unspecified: Secondary | ICD-10-CM

## 2024-04-14 DIAGNOSIS — E039 Hypothyroidism, unspecified: Secondary | ICD-10-CM

## 2024-04-14 DIAGNOSIS — I10 Essential (primary) hypertension: Secondary | ICD-10-CM

## 2024-04-14 DIAGNOSIS — E7800 Pure hypercholesterolemia, unspecified: Secondary | ICD-10-CM

## 2024-04-14 DIAGNOSIS — Z0000 Encounter for general adult medical examination without abnormal findings: Secondary | ICD-10-CM

## 2024-04-14 DIAGNOSIS — R7309 Other abnormal glucose: Secondary | ICD-10-CM

## 2024-04-14 LAB — COMPLETE METABOLIC PANEL WITHOUT GFR
AG Ratio: 2 (calc) (ref 1.0–2.5)
ALT: 6 U/L (ref 6–29)
AST: 17 U/L (ref 10–35)
Albumin: 4.1 g/dL (ref 3.6–5.1)
Alkaline phosphatase (APISO): 53 U/L (ref 37–153)
BUN: 18 mg/dL (ref 7–25)
CO2: 29 mmol/L (ref 20–32)
Calcium: 8.9 mg/dL (ref 8.6–10.4)
Chloride: 100 mmol/L (ref 98–110)
Creat: 0.84 mg/dL (ref 0.60–0.95)
Globulin: 2.1 g/dL (ref 1.9–3.7)
Glucose, Bld: 87 mg/dL (ref 65–99)
Potassium: 4.2 mmol/L (ref 3.5–5.3)
Sodium: 136 mmol/L (ref 135–146)
Total Bilirubin: 0.7 mg/dL (ref 0.2–1.2)
Total Protein: 6.2 g/dL (ref 6.1–8.1)

## 2024-04-14 LAB — LIPID PANEL
Cholesterol: 185 mg/dL (ref <200)
HDL: 75 mg/dL (ref >=50)
LDL Cholesterol (Calc): 96 mg/dL
Non-HDL Cholesterol (Calc): 110 mg/dL (ref <130)
Total CHOL/HDL Ratio: 2.5 (calc) (ref <5.0)
Triglycerides: 59 mg/dL (ref <150)

## 2024-04-14 LAB — CBC WITH DIFFERENTIAL/PLATELET
Absolute Lymphocytes: 1879 {cells}/uL (ref 850–3900)
Absolute Monocytes: 519 {cells}/uL (ref 200–950)
Basophils Absolute: 40 {cells}/uL (ref 0–200)
Basophils Relative: 0.9 %
Eosinophils Absolute: 158 {cells}/uL (ref 15–500)
Eosinophils Relative: 3.6 %
HCT: 38.2 % (ref 35.0–45.0)
Hemoglobin: 12.6 g/dL (ref 11.7–15.5)
MCH: 29.1 pg (ref 27.0–33.0)
MCHC: 33 g/dL (ref 32.0–36.0)
MCV: 88.2 fL (ref 80.0–100.0)
MPV: 10.3 fL (ref 7.5–12.5)
Monocytes Relative: 11.8 %
Neutro Abs: 1804 {cells}/uL (ref 1500–7800)
Neutrophils Relative %: 41 %
Platelets: 179 10*3/uL (ref 140–400)
RBC: 4.33 10*6/uL (ref 3.80–5.10)
RDW: 13 % (ref 11.0–15.0)
Total Lymphocyte: 42.7 %
WBC: 4.4 10*3/uL (ref 3.8–10.8)

## 2024-04-14 LAB — HEMOGLOBIN A1C
Hgb A1c MFr Bld: 5.7 % — ABNORMAL HIGH (ref <5.7)
Mean Plasma Glucose: 117 mg/dL
eAG (mmol/L): 6.5 mmol/L

## 2024-04-14 LAB — TSH: TSH: 3.01 m[IU]/L (ref 0.40–4.50)

## 2024-04-14 LAB — T4, FREE: Free T4: 1.2 ng/dL (ref 0.8–1.8)

## 2024-04-15 ENCOUNTER — Ambulatory Visit
Admission: RE | Admit: 2024-04-15 | Discharge: 2024-04-15 | Disposition: A | Discharge status: Home / Self Care | Source: Ambulatory Visit | Attending: Family Medicine | Admitting: Family Medicine

## 2024-04-15 ENCOUNTER — Encounter (INDEPENDENT_AMBULATORY_CARE_PROVIDER_SITE_OTHER): Payer: Self-pay

## 2024-04-15 DIAGNOSIS — Z1231 Encounter for screening mammogram for malignant neoplasm of breast: Secondary | ICD-10-CM | POA: Insufficient documentation

## 2024-04-22 ENCOUNTER — Encounter: Payer: Self-pay | Admitting: Family Medicine

## 2024-04-22 ENCOUNTER — Ambulatory Visit (INDEPENDENT_AMBULATORY_CARE_PROVIDER_SITE_OTHER): Payer: Self-pay | Admitting: Family Medicine

## 2024-04-22 VITALS — BP 142/80 | HR 80 | Ht 66.0 in | Wt 129.1 lb

## 2024-04-22 DIAGNOSIS — R350 Frequency of micturition: Secondary | ICD-10-CM | POA: Diagnosis not present

## 2024-04-22 DIAGNOSIS — I10 Essential (primary) hypertension: Secondary | ICD-10-CM

## 2024-04-22 DIAGNOSIS — R3129 Other microscopic hematuria: Secondary | ICD-10-CM | POA: Diagnosis not present

## 2024-04-22 DIAGNOSIS — Z0000 Encounter for general adult medical examination without abnormal findings: Secondary | ICD-10-CM | POA: Diagnosis not present

## 2024-04-22 DIAGNOSIS — Z23 Encounter for immunization: Secondary | ICD-10-CM

## 2024-04-22 DIAGNOSIS — E039 Hypothyroidism, unspecified: Secondary | ICD-10-CM

## 2024-04-22 LAB — POCT URINALYSIS DIPSTICK
Bilirubin, UA: NEGATIVE
Glucose, UA: NEGATIVE
Ketones, UA: NEGATIVE
Leukocytes, UA: NEGATIVE
Nitrite, UA: NEGATIVE
Odor: POSITIVE
Protein, UA: NEGATIVE
Spec Grav, UA: 1.015 (ref 1.010–1.025)
Urobilinogen, UA: 0.2 U/dL
pH, UA: 5 (ref 5.0–8.0)

## 2024-04-22 MED ORDER — VALSARTAN 80 MG PO TABS: 80.0000 mg | ORAL_TABLET | Freq: Every day | ORAL | 3 refills | Status: AC

## 2024-04-22 MED ORDER — LEVOTHYROXINE SODIUM 50 MCG PO TABS: 50.0000 ug | ORAL_TABLET | Freq: Every day | ORAL | 3 refills | Status: AC

## 2024-04-22 NOTE — Patient Instructions (Addendum)
 Thank you for coming to the office today.  Tdap vaccine today tetanus shot  I am not worried about the blood sugar early early Pre-Diabetes  Recent Labs    04/24/23 0927 04/14/24 0911  HGBA1C 5.6 5.7*   Circulation no significant concern with arteries or veins.  Some venous insufficiency is very common.  Next time we can consider Vitamin Testing if interested.  Urine we sent to the lab for a Culture, mild blood seen.  Refills sent.  Please schedule a Follow-up Appointment to: Return in about 4 months (around 08/23/2024) for 4 months September - Follow-up BP, Flu Shot, Consider B12, Vit D.  If you have any other questions or concerns, please feel free to call the office or send a message through MyChart. You may also schedule an earlier appointment if necessary.  Additionally, you may be receiving a survey about your experience at our office within a few days to 1 week by e-mail or mail. We value your feedback.  Domingo Friend, DO [facility], [place]

## 2024-04-22 NOTE — Progress Notes (Signed)
 Subjective:    Patient ID: Brittany Oneal, female    DOB: 1931-07-17, 89 y.o.   MRN: [id number]  Brittany Oneal is a 89 y.o. female presenting on 04/22/2024 for Annual Exam   HPI  Discussed the use of AI scribe software for clinical note transcription with the patient, who gave verbal consent to proceed.  History of Present Illness   Brittany Oneal is a 89 year old female who presents for an annual physical exam.  She has concerns about urinary frequency and urgency, which she has experienced previously. There is no dysuria, and previous urine cultures have shown no bacterial growth.      CHRONIC HTN: Elevated BP here in office Home BP readings 120-140 mostly 130s Lab showed mostly normal creatinine, slightly increased but overall not a concern Current Meds - Valsartan  80mg  daily Reports good compliance, took meds today. Tolerating well, w/o complaints - Taking ASA 81mg  daily tolerating well    Hypothyroidism, chronic Last lab normal TSH Stable chronic problem, still taking Levothyroxine  50mcg daily She denies any symptoms of low thyroid hormone at this time   Elevated A1c Last A1c 5.7, slightly elevated 5.4 to 5.6 No new concerns or updates. Meds: Never on medication for blood sugar Currently on ACEi Lifestyle: - Weight is stable - Diet (Still focusing on healthy diet, avoiding sweets, improving appetite, drinks mostly water, some tea, coffee)  - Exercise (Regular exercises mostly outdoor and at home) - Family history of DM2 (Father) Denies hypoglycemia   Mild Elevated LDL - Reports no concerns. Last lipid panel 03/2024 with LDL < 100 - Currently taking Fish Oil omega 3 1g daily - No known history of CAD, MI, CVA  - She has been taking ASA 81mg  daily since last visit, doing well without bleeding or other episodes - She has never been on Statin medicine, has declined this in the past   PAD, mild Venous Insufficiency She had recent vascular evaluation Dr Vonna Guardian [facility]  07/03/23, had abnormal PAD screening at home test, and then we pursued the eval at Vascular. They did ABIs and determined mild calcification of arteries lower extremity but not significant for PAD that requires intervention. No significant symptoms. They did identify Chronic Venous Insufficiency and symptoms. She can use RICE therapy compression elevation to manage it. No further treatment required.   Health Maintenance: Future consider RSV vaccine at pharmacy  Tdap today     04/22/2024   10:50 AM 05/09/2023    2:58 PM 03/15/2023    3:01 PM  Depression screen PHQ 2/9  Decreased Interest 0 0 0  Down, Depressed, Hopeless 0 0 0  PHQ - 2 Score 0 0 0  Altered sleeping 0 0 0  Tired, decreased energy  0 0  Change in appetite 0 0 0  Feeling bad or failure about yourself  0 0 0  Trouble concentrating 0 0 0  Moving slowly or fidgety/restless 0 0 0  Suicidal thoughts 0 0 0  PHQ-9 Score 0 0 0  Difficult doing work/chores  Not difficult at all Not difficult at all       04/22/2024   10:50 AM 05/09/2023    2:58 PM 05/03/2022    2:30 PM  GAD 7 : Generalized Anxiety Score  Nervous, Anxious, on Edge 0 0 0  Control/stop worrying 0 0 0  Worry too much - different things 0 0 0  Trouble relaxing 0 0 0  Restless 0 0 0  Easily annoyed or  irritable 0 0 0  Afraid - awful might happen 0 0 0  Total GAD 7 Score 0 0 0  Anxiety Difficulty  Not difficult at all Not difficult at all     Past Medical History:  Diagnosis Date   Arthritis    Family history of malignant neoplasm of breast    Family history of malignant neoplasm of gastrointestinal tract    History of colon cancer    Personal history of malignant neoplasm of large intestine    Special screening for malignant neoplasms, colon    Thyroid disease    Past Surgical History:  Procedure Laterality Date   ABDOMINAL HYSTERECTOMY  1983   COLON SURGERY  1990   resection d/t cancer   COLONOSCOPY  1997,2001,2004,2006, 2011   COLONOSCOPY WITH  PROPOFOL  N/A 10/24/2017   Procedure: COLONOSCOPY WITH PROPOFOL ;  Surgeon: Jerlean Mood, MD;  Location: [facility] ENDOSCOPY;  Service: Endoscopy;  Laterality: N/A;   EYE SURGERY  Sept 2013   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   SALPINGOOPHORECTOMY     Social History   Socioeconomic History   Marital status: Married    Spouse name: Gabbie Marzo   Number of children: 2   Years of education: Not on file   Highest education level: High school graduate  Occupational History   Occupation: Retired  Tobacco Use   Smoking status: Never   Smokeless tobacco: Never  Vaping Use   Vaping status: Never Used  Substance and Sexual Activity   Alcohol use: No    Alcohol/week: 0.0 standard drinks of alcohol   Drug use: No   Sexual activity: Not on file  Other Topics Concern   Not on file  Social History Narrative   Gym one to 2 days  Week    Social Drivers of Corporate investment banker Strain: Low Risk  (03/15/2023)   Overall Financial Resource Strain (CARDIA)    Difficulty of Paying Living Expenses: Not hard at all  Food Insecurity: No Food Insecurity (03/15/2023)   Hunger Vital Sign    Worried About Running Out of Food in the Last Year: Never true    Ran Out of Food in the Last Year: Never true  Transportation Needs: No Transportation Needs (03/15/2023)   PRAPARE - Administrator, Civil Service (Medical): No    Lack of Transportation (Non-Medical): No  Physical Activity: Sufficiently Active (03/15/2023)   Exercise Vital Sign    Days of Exercise per Week: 3 days    Minutes of Exercise per Session: 60 min  Stress: No Stress Concern Present (03/15/2023)   [employer] of Occupational Health - Occupational Stress Questionnaire    Feeling of Stress : Not at all  Social Connections: Socially Integrated (03/15/2023)   Social Connection and Isolation Panel [NHANES]    Frequency of Communication with Friends and Family: More than three times a week     Frequency of Social Gatherings with Friends and Family: Three times a week    Attends Religious Services: More than 4 times per year    Active Member of Clubs or Organizations: Yes    Attends Banker Meetings: More than 4 times per year    Marital Status: Married  Catering manager Violence: Not At Risk (03/15/2023)   Humiliation, Afraid, Rape, and Kick questionnaire    Fear of Current or Ex-Partner: No    Emotionally Abused: No    Physically Abused: No  Sexually Abused: No   Family History  Problem Relation Age of Onset   Hypertension Mother    Hypertension Father    Diabetes Father    Cancer Other        FH of breast and colon cancer   Cancer Brother        colon   Cancer Sister        breast   Breast cancer Sister 53   Current Outpatient Medications on File Prior to Visit  Medication Sig   aspirin  EC 81 MG tablet Take 1 tablet (81 mg total) by mouth daily.   fluticasone (FLONASE) 50 MCG/ACT nasal spray Place into both nostrils daily.   Glucosamine-Chondroitin 500-400 MG CAPS Take 2 capsules by mouth daily.   ipratropium (ATROVENT ) 0.06 % nasal spray Place 2 sprays into both nostrils 4 (four) times daily as needed for rhinitis. For allergies and congestion as needed   Omega-3 Fatty Acids (FISH OIL) 1000 MG CAPS Take 1 capsule by mouth daily.    No current facility-administered medications on file prior to visit.    Review of Systems  Constitutional:  Positive for fatigue. Negative for activity change, appetite change, chills, diaphoresis and fever.  HENT:  Negative for congestion and hearing loss.   Eyes:  Negative for visual disturbance.  Respiratory:  Negative for cough, chest tightness, shortness of breath and wheezing.   Cardiovascular:  Negative for chest pain, palpitations and leg swelling.  Gastrointestinal:  Negative for abdominal pain, constipation, diarrhea, nausea and vomiting.  Genitourinary:  Negative for dysuria, frequency and hematuria.   Musculoskeletal:  Negative for arthralgias and neck pain.  Skin:  Negative for rash.  Neurological:  Negative for dizziness, weakness, light-headedness, numbness and headaches.  Hematological:  Negative for adenopathy.  Psychiatric/Behavioral:  Negative for behavioral problems, dysphoric mood and sleep disturbance.    Per HPI unless specifically indicated above     Objective:     BP (!) 142/80 (BP Location: Left Arm, Cuff Size: Normal)   Pulse 80   Ht 5\' 6"  (1.676 m)   Wt 129 lb 2 oz (58.6 kg)   SpO2 95%   BMI 20.84 kg/m   Wt Readings from Last 3 Encounters:  04/22/24 129 lb 2 oz (58.6 kg)  08/14/23 125 lb (56.7 kg)  07/03/23 123 lb 9.6 oz (56.1 kg)    Physical Exam Vitals and nursing note reviewed.  Constitutional:      General: She is not in acute distress.    Appearance: She is well-developed. She is not diaphoretic.     Comments: Well-appearing, comfortable, cooperative  HENT:     Head: Normocephalic and atraumatic.  Eyes:     General:        Right eye: No discharge.        Left eye: No discharge.     Conjunctiva/sclera: Conjunctivae normal.     Pupils: Pupils are equal, round, and reactive to light.  Neck:     Thyroid: No thyromegaly.     Vascular: No carotid bruit.  Cardiovascular:     Rate and Rhythm: Normal rate and regular rhythm.     Pulses: Normal pulses.     Heart sounds: Normal heart sounds. No murmur heard. Pulmonary:     Effort: Pulmonary effort is normal. No respiratory distress.     Breath sounds: Normal breath sounds. No wheezing or rales.  Abdominal:     General: Bowel sounds are normal. There is no distension.     Palpations:  Abdomen is soft. There is no mass.     Tenderness: There is no abdominal tenderness.  Musculoskeletal:        General: No tenderness. Normal range of motion.     Cervical back: Normal range of motion and neck supple.     Right lower leg: No edema.     Left lower leg: No edema.     Comments: Upper / Lower  Extremities: - Normal muscle tone, strength bilateral upper extremities 5/5, lower extremities 5/5  Lymphadenopathy:     Cervical: No cervical adenopathy.  Skin:    General: Skin is warm and dry.     Findings: No erythema or rash.  Neurological:     Mental Status: She is alert and oriented to person, place, and time.     Comments: Distal sensation intact to light touch all extremities  Psychiatric:        Mood and Affect: Mood normal.        Behavior: Behavior normal.        Thought Content: Thought content normal.     Comments: Well groomed, good eye contact, normal speech and thoughts     Results for orders placed or performed in visit on 04/22/24  POCT Urinalysis Dipstick   Collection Time: 04/22/24 10:58 AM  Result Value Ref Range   Color, UA yellow    Clarity, UA cloudy    Glucose, UA Negative Negative   Bilirubin, UA negative    Ketones, UA negative    Spec Grav, UA 1.015 1.010 - 1.025   Blood, UA small    pH, UA 5.0 5.0 - 8.0   Protein, UA Negative Negative   Urobilinogen, UA 0.2 0.2 or 1.0 E.U./dL   Nitrite, UA negative    Leukocytes, UA Negative Negative   Appearance     Odor positive       Assessment & Plan:   Problem List Items Addressed This Visit     Hypothyroidism (Chronic)   Relevant Medications   levothyroxine  (SYNTHROID ) 50 MCG tablet   Essential hypertension   Relevant Medications   valsartan  (DIOVAN ) 80 MG tablet   Other Visit Diagnoses       Annual physical exam    -  Primary     Frequency of urination       Relevant Orders   POCT Urinalysis Dipstick (Completed)   Urine Culture     Need for diphtheria-tetanus-pertussis (Tdap) vaccine       Relevant Orders   Tdap vaccine greater than or equal to 7yo IM (Completed)     Microscopic hematuria       Relevant Orders   Urine Culture        Updated Health Maintenance information Reviewed recent lab results with patient Encouraged improvement to lifestyle with diet and exercise Goal of  weight loss   Blood in urine Urinalysis showed mild hematuria without infection. Symptoms of Urinary Frequency only but no dysuria. - Send urine for culture.  Hypertension Chronic variable readings. Historically elevated.  Blood pressure readings variable, improved to 142/80 in office. - Continue current antihypertensive regimen. Valsartan  80mg  daily - Monitor blood pressure at home. - Recheck blood pressure in the office if needed.  Prediabetes Elevated Hemoglobin A1c at 5.7%, indicating borderline prediabetes. - Continue current diet. - Monitor A1c levels in future visits. No treatment required  Chronic venous insufficiency Followed by [facility], had concern for PAD but determined only mild and no treatment required on repeat diagnostic  evaluation. Mild chronic venous insufficiency, not severe. - Consider use of compression stockings. - Elevate legs in the evening. RICE therapy  Hypothyroidism Thyroid function tests normal on 50 mcg levothyroxine . - Continue current dose of levothyroxine  50 mcg daily.  Fatigue Consider add Vitamin testing next time B12 / D  General Health Maintenance Up to date on pneumonia and shingles vaccines. Eligible for Tdap booster. Discussed optional RSV vaccine for those 75 and older at pharmacy - Administer Tdap booster. - Consider RSV vaccine at pharmacy if interested.        Orders Placed This Encounter  Procedures   Urine Culture   Tdap vaccine greater than or equal to 7yo IM   POCT Urinalysis Dipstick    Meds ordered this encounter  Medications   valsartan  (DIOVAN ) 80 MG tablet    Sig: Take 1 tablet (80 mg total) by mouth daily.    Dispense:  90 tablet    Refill:  3   levothyroxine  (SYNTHROID ) 50 MCG tablet    Sig: Take 1 tablet (50 mcg total) by mouth daily before breakfast.    Dispense:  90 tablet    Refill:  3    Please send a replace/new response with 90-Day Supply if appropriate to maximize member  benefit. Requesting 1 year supply.     Follow up plan: Return in about 4 months (around 08/23/2024) for 4 months September - Follow-up BP, Flu Shot, Consider B12, Vit D.  Domingo Friend, DO [facility] 04/22/2024, 10:55 AM

## 2024-04-23 LAB — URINE CULTURE
MICRO NUMBER:: 16504142
Result:: NO GROWTH
SPECIMEN QUALITY:: ADEQUATE

## 2024-04-24 ENCOUNTER — Ambulatory Visit: Payer: Self-pay | Admitting: Family Medicine

## 2024-07-04 ENCOUNTER — Ambulatory Visit

## 2024-07-04 DIAGNOSIS — Z0000 Encounter for general adult medical examination without abnormal findings: Secondary | ICD-10-CM | POA: Diagnosis not present

## 2024-07-04 NOTE — Patient Instructions (Signed)
 Brittany Oneal , Thank you for taking time out of your busy schedule to complete your Annual Wellness Visit with me. I enjoyed our conversation and look forward to speaking with you again next year. I, as well as your care team,  appreciate your ongoing commitment to your health goals. Please review the following plan we discussed and let me know if I can assist you in the future.   Follow up Visits: 07/17/25 @ 2:00 PM BY PHONE We will see or speak with you next year for your Next Medicare AWV with our clinical staff Have you seen your provider in the last 6 months (3 months if uncontrolled diabetes)? Yes  Clinician Recommendations:  Aim for 30 minutes of exercise or brisk walking, 6-8 glasses of water, and 5 servings of fruits and vegetables each day. TAKE CARE!      This is a list of the screenings recommended for you:  Health Maintenance  Topic Date Due   Flu Shot  06/27/2024   Medicare Annual Wellness Visit  07/04/2025   DTaP/Tdap/Td vaccine (3 - Td or Tdap) 04/22/2034   Pneumococcal Vaccine for age over 1  Completed   DEXA scan (bone density measurement)  Completed   Zoster (Shingles) Vaccine  Completed   Hepatitis B Vaccine  Aged Out   HPV Vaccine  Aged Out   Meningitis B Vaccine  Aged Out   COVID-19 Vaccine  Discontinued    Advanced directives: (ACP Link)Information on Advanced Care Planning can be found at [employer] of [facility] Advance Health Care Directives Advance Health Care Directives. [email or web address]  Advance Care Planning is important because it:  [x]  Makes sure you receive the medical care that is consistent with your values, goals, and preferences  [x]  It provides guidance to your family and loved ones and reduces their decisional burden about whether or not they are making the right decisions based on your wishes.  Follow the link provided in your after visit summary or read over the paperwork we have mailed to you to help you started getting your Advance Directives  in place. If you need assistance in completing these, please reach out to us  so that we can help you!

## 2024-07-04 NOTE — Progress Notes (Signed)
 Subjective:   Brittany Oneal is a 89 y.o. who presents for a Medicare Wellness preventive visit.  As a reminder, Annual Wellness Visits don't include a physical exam, and some assessments may be limited, especially if this visit is performed virtually. We may recommend an in-person follow-up visit with your provider if needed.  Visit Complete: Virtual I connected with  Jerlean F Roop on 07/04/24 by a audio enabled telemedicine application and verified that I am speaking with the correct person using two identifiers.  Patient Location: Home  Provider Location: Home Office  I discussed the limitations of evaluation and management by telemedicine. The patient expressed understanding and agreed to proceed.  Vital Signs: Because this visit was a virtual/telehealth visit, some criteria may be missing or patient reported. Any vitals not documented were not able to be obtained and vitals that have been documented are patient reported.  VideoDeclined- This patient declined Librarian, academic. Therefore the visit was completed with audio only.  Persons Participating in Visit: Patient.  AWV Questionnaire: No: Patient Medicare AWV questionnaire was not completed prior to this visit.  Cardiac Risk Factors include: advanced age (>60men, >41 women);dyslipidemia;hypertension     Objective:    There were no vitals filed for this visit. There is no height or weight on file to calculate BMI.     07/04/2024   11:36 AM 03/15/2023    3:03 PM 08/05/2019   11:04 AM 07/23/2018    1:37 PM 10/24/2017    8:10 AM 07/17/2017    2:26 PM  Advanced Directives  Does Patient Have a Medical Advance Directive? No No Yes Yes  Yes  Yes   Type of Advance Directive   Living will;Healthcare Power of Attorney Living will;Healthcare Power of Attorney Living will   Copy of Healthcare Power of Attorney in Chart?   Yes - validated most recent copy scanned in chart (See row information) No - copy  requested     Would patient like information on creating a medical advance directive? No - Patient declined No - Patient declined         Data saved with a previous flowsheet row definition    Current Medications (verified) Outpatient Encounter Medications as of 07/04/2024  Medication Sig   aspirin  EC 81 MG tablet Take 1 tablet (81 mg total) by mouth daily.   fluticasone (FLONASE) 50 MCG/ACT nasal spray Place into both nostrils daily. (Patient taking differently: Place into both nostrils daily. TAKES PRN)   Glucosamine-Chondroitin 500-400 MG CAPS Take 2 capsules by mouth daily.   ipratropium (ATROVENT ) 0.06 % nasal spray Place 2 sprays into both nostrils 4 (four) times daily as needed for rhinitis. For allergies and congestion as needed   levothyroxine  (SYNTHROID ) 50 MCG tablet Take 1 tablet (50 mcg total) by mouth daily before breakfast.   Omega-3 Fatty Acids (FISH OIL) 1000 MG CAPS Take 1 capsule by mouth daily.    valsartan  (DIOVAN ) 80 MG tablet Take 1 tablet (80 mg total) by mouth daily.   No facility-administered encounter medications on file as of 07/04/2024.    Allergies (verified) Contrast media [iodinated contrast media], Sulfa antibiotics, and Shellfish allergy   History: Past Medical History:  Diagnosis Date   Arthritis    Family history of malignant neoplasm of breast    Family history of malignant neoplasm of gastrointestinal tract    History of colon cancer    Personal history of malignant neoplasm of large intestine    Special screening  for malignant neoplasms, colon    Thyroid disease    Past Surgical History:  Procedure Laterality Date   ABDOMINAL HYSTERECTOMY  1983   COLON SURGERY  1990   resection d/t cancer   COLONOSCOPY  1997,2001,2004,2006, 2011   COLONOSCOPY WITH PROPOFOL  N/A 10/24/2017   Procedure: COLONOSCOPY WITH PROPOFOL ;  Surgeon: Dellie Louanne MATSU, MD;  Location: [facility] ENDOSCOPY;  Service: Endoscopy;  Laterality: N/A;   EYE SURGERY  Sept 2013    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   SALPINGOOPHORECTOMY     Family History  Problem Relation Age of Onset   Hypertension Mother    Hypertension Father    Diabetes Father    Cancer Other        FH of breast and colon cancer   Cancer Brother        colon   Cancer Sister        breast   Breast cancer Sister 72   Social History   Socioeconomic History   Marital status: Married    Spouse name: Jestine Bicknell   Number of children: 2   Years of education: Not on file   Highest education level: High school graduate  Occupational History   Occupation: Retired  Tobacco Use   Smoking status: Never   Smokeless tobacco: Never  Vaping Use   Vaping status: Never Used  Substance and Sexual Activity   Alcohol use: No    Alcohol/week: 0.0 standard drinks of alcohol   Drug use: No   Sexual activity: Not on file  Other Topics Concern   Not on file  Social History Narrative   Gym one to 2 days  Week    Social Drivers of Corporate investment banker Strain: Low Risk  (07/04/2024)   Overall Financial Resource Strain (CARDIA)    Difficulty of Paying Living Expenses: Not hard at all  Food Insecurity: No Food Insecurity (07/04/2024)   Hunger Vital Sign    Worried About Running Out of Food in the Last Year: Never true    Ran Out of Food in the Last Year: Never true  Transportation Needs: No Transportation Needs (07/04/2024)   PRAPARE - Administrator, Civil Service (Medical): No    Lack of Transportation (Non-Medical): No  Physical Activity: Sufficiently Active (07/04/2024)   Exercise Vital Sign    Days of Exercise per Week: 3 days    Minutes of Exercise per Session: 60 min  Stress: No Stress Concern Present (07/04/2024)   [employer] of Occupational Health - Occupational Stress Questionnaire    Feeling of Stress: Not at all  Social Connections: Socially Integrated (07/04/2024)   Social Connection and Isolation Panel    Frequency of Communication with Friends  and Family: More than three times a week    Frequency of Social Gatherings with Friends and Family: Once a week    Attends Religious Services: More than 4 times per year    Active Member of [employer] or Organizations: Yes    Attends Engineer, structural: More than 4 times per year    Marital Status: Married    Tobacco Counseling Counseling given: Not Answered    Clinical Intake:  Pre-visit preparation completed: Yes  Pain : No/denies pain     BMI - recorded: 20.8 Nutritional Status: BMI of 19-24  Normal Nutritional Risks: None Diabetes: No  Lab Results  Component Value Date   HGBA1C 5.7 (H) 04/14/2024   HGBA1C  5.6 04/24/2023   HGBA1C 5.4 04/27/2022     How often do you need to have someone help you when you read instructions, pamphlets, or other written materials from your doctor or pharmacy?: 1 - Never  Interpreter Needed?: No  Information entered by :: JHONNIE DAS, LPN   Activities of Daily Living     07/04/2024   11:38 AM  In your present state of health, do you have any difficulty performing the following activities:  Hearing? 1  Vision? 0  Difficulty concentrating or making decisions? 0  Walking or climbing stairs? 0  Dressing or bathing? 0  Doing errands, shopping? 0  Preparing Food and eating ? N  Using the Toilet? N  In the past six months, have you accidently leaked urine? Y  Do you have problems with loss of bowel control? N  Managing your Medications? N  Managing your Finances? N  Housekeeping or managing your Housekeeping? N    Patient Care Team: Edman Marsa PARAS, DO as PCP - General (Family Medicine) Dellie Louanne MATSU, MD (General Surgery) Dasher, Alm LABOR, MD (Dermatology) Dingeldein, Elspeth, MD (Ophthalmology)  I have updated your Care Teams any recent Medical Services you may have received from other providers in the past year.     Assessment:   This is a routine wellness examination for Leatha.  Hearing/Vision  screen Hearing Screening - Comments:: WEARS AIDS, BOTH EARS Vision Screening - Comments:: WEARS GLASSES MOST OF DAY- DR.DINGELDEIN- HAS APPT ON 8/19   Goals Addressed             This Visit's Progress    Cut out extra servings         Depression Screen     07/04/2024   11:34 AM 04/22/2024   10:50 AM 05/09/2023    2:58 PM 03/15/2023    3:01 PM 11/02/2022   11:23 AM 05/03/2022    2:30 PM 03/09/2022   10:14 AM  PHQ 2/9 Scores  PHQ - 2 Score 0 0 0 0 0 0 0  PHQ- 9 Score 0 0 0 0 0 0     Fall Risk     07/04/2024   11:37 AM 04/22/2024   10:50 AM 03/15/2023    3:03 PM 11/02/2022   11:23 AM 05/03/2022    2:29 PM  Fall Risk   Falls in the past year? 0 0 0 0 0  Number falls in past yr: 0  0 0 0  Injury with Fall? 0  0 0 0  Risk for fall due to : No Fall Risks  No Fall Risks No Fall Risks No Fall Risks  Follow up Falls evaluation completed;Falls prevention discussed  Falls prevention discussed;Falls evaluation completed Falls evaluation completed  Falls evaluation completed      Data saved with a previous flowsheet row definition    MEDICARE RISK AT HOME:  Medicare Risk at Home Any stairs in or around the home?: Yes If so, are there any without handrails?: No Home free of loose throw rugs in walkways, pet beds, electrical cords, etc?: Yes Adequate lighting in your home to reduce risk of falls?: Yes Life alert?: No Use of a cane, walker or w/c?: No Grab bars in the bathroom?: Yes Shower chair or bench in shower?: No Elevated toilet seat or a handicapped toilet?: No  TIMED UP AND GO:  Was the test performed?  No  Cognitive Function: 6CIT completed    08/22/2016   10:12 AM  MMSE - Mini  Mental State Exam  Orientation to time 5   Orientation to Place 5   Registration 3   Attention/ Calculation 5   Recall 3   Language- name 2 objects 2   Language- repeat 1  Language- follow 3 step command 3   Language- read & follow direction 1   Write a sentence 1   Copy design 0   Total  score 29      Data saved with a previous flowsheet row definition        07/04/2024   11:39 AM 03/15/2023    3:06 PM 03/09/2022   10:03 AM 07/23/2018    2:08 PM 07/17/2017    2:29 PM  6CIT Screen  What Year? 0 points 0 points 0 points 0 points 0 points  What month? 0 points 0 points 0 points 0 points 0 points  What time? 0 points 0 points 0 points 0 points 0 points  Count back from 20 0 points 0 points 0 points 0 points 0 points  Months in reverse 0 points 0 points 0 points 0 points 0 points  Repeat phrase 0 points 0 points 2 points 2 points 2 points  Total Score 0 points 0 points 2 points 2 points 2 points    Immunizations Immunization History  Administered Date(s) Administered   Fluad Quad(high Dose 65+) 08/21/2019, 08/19/2020, 08/23/2021, 08/28/2022   Fluad Trivalent(High Dose 65+) 08/14/2023   Influenza, High Dose Seasonal PF 08/03/2015, 08/22/2016, 07/31/2017, 09/04/2018   Influenza, Seasonal, Injecte, Preservative Fre 08/20/2013   Influenza-Unspecified 08/03/2015   PFIZER(Purple Top)SARS-COV-2 Vaccination 12/23/2019, 01/13/2020, 09/23/2020   PNEUMOCOCCAL CONJUGATE-20 05/03/2022   Pneumococcal Conjugate-13 09/29/2014, 10/07/2015   Pneumococcal Polysaccharide-23 11/27/1994   Tdap 01/21/2014, 04/22/2024   Zoster Recombinant(Shingrix ) 08/18/2019, 10/28/2019    Screening Tests Health Maintenance  Topic Date Due   INFLUENZA VACCINE  06/27/2024   Medicare Annual Wellness (AWV)  07/04/2025   DTaP/Tdap/Td (3 - Td or Tdap) 04/22/2034   Pneumococcal Vaccine: 50+ Years  Completed   DEXA SCAN  Completed   Zoster Vaccines- Shingrix   Completed   Hepatitis B Vaccines  Aged Out   HPV VACCINES  Aged Out   Meningococcal B Vaccine  Aged Out   COVID-19 Vaccine  Discontinued    Health Maintenance  Health Maintenance Due  Topic Date Due   INFLUENZA VACCINE  06/27/2024   Health Maintenance Items Addressed: UP TO DATE ON SHOTS EXCEPT COVID  Additional Screening:  Vision  Screening: Recommended annual ophthalmology exams for early detection of glaucoma and other disorders of the eye. Would you like a referral to an eye doctor? No    Dental Screening: Recommended annual dental exams for proper oral hygiene  Community Resource Referral / Chronic Care Management: CRR required this visit?  No   CCM required this visit?  No   Plan:    I have personally reviewed and noted the following in the patient's chart:   Medical and social history Use of alcohol, tobacco or illicit drugs  Current medications and supplements including opioid prescriptions. Patient is not currently taking opioid prescriptions. Functional ability and status Nutritional status Physical activity Advanced directives List of other physicians Hospitalizations, surgeries, and ER visits in previous 12 months Vitals Screenings to include cognitive, depression, and falls Referrals and appointments  In addition, I have reviewed and discussed with patient certain preventive protocols, quality metrics, and best practice recommendations. A written personalized care plan for preventive services as well as general preventive health recommendations were provided  to patient.   Jhonnie GORMAN Das, LPN   11/27/7972   After Visit Summary: (MyChart) Due to this being a telephonic visit, the after visit summary with patients personalized plan was offered to patient via MyChart   Notes: Nothing significant to report at this time.

## 2024-08-15 ENCOUNTER — Ambulatory Visit (INDEPENDENT_AMBULATORY_CARE_PROVIDER_SITE_OTHER)

## 2024-08-15 DIAGNOSIS — Z23 Encounter for immunization: Secondary | ICD-10-CM

## 2024-08-28 ENCOUNTER — Ambulatory Visit: Admitting: Family Medicine

## 2024-08-28 ENCOUNTER — Other Ambulatory Visit: Payer: Self-pay | Admitting: Family Medicine

## 2024-08-28 ENCOUNTER — Encounter: Payer: Self-pay | Admitting: Family Medicine

## 2024-08-28 VITALS — BP 140/86 | HR 70 | Ht 66.0 in | Wt 127.0 lb

## 2024-08-28 DIAGNOSIS — R7309 Other abnormal glucose: Secondary | ICD-10-CM

## 2024-08-28 DIAGNOSIS — I739 Peripheral vascular disease, unspecified: Secondary | ICD-10-CM

## 2024-08-28 DIAGNOSIS — E039 Hypothyroidism, unspecified: Secondary | ICD-10-CM

## 2024-08-28 DIAGNOSIS — E559 Vitamin D deficiency, unspecified: Secondary | ICD-10-CM

## 2024-08-28 DIAGNOSIS — I10 Essential (primary) hypertension: Secondary | ICD-10-CM

## 2024-08-28 DIAGNOSIS — E538 Deficiency of other specified B group vitamins: Secondary | ICD-10-CM

## 2024-08-28 DIAGNOSIS — E7800 Pure hypercholesterolemia, unspecified: Secondary | ICD-10-CM

## 2024-08-28 DIAGNOSIS — Z0000 Encounter for general adult medical examination without abnormal findings: Secondary | ICD-10-CM

## 2024-08-28 NOTE — Progress Notes (Addendum)
 Subjective:    Patient ID: Brittany Oneal, female    DOB: Dec 20, 1930, 89 y.o.   MRN: [id number]  Brittany Oneal is a 89 y.o. female presenting on 08/28/2024 for Medical Management of Chronic Issues   HPI  Discussed the use of AI scribe software for clinical note transcription with the patient, who gave verbal consent to proceed.  History of Present Illness   Brittany Oneal is a 89 year old female who presents for a routine follow-up to monitor blood pressure and discuss vitamin supplementation.  Vitamin supplementation and nutritional status - Considering evaluation of vitamin B12 and vitamin D levels to determine need for supplementation. - Currently takes a multivitamin, omega-3, glucosamine, and chondroitin, but is unsure of the specific contents of the multivitamin.  CHRONIC HTN: Elevated BP here in office Home BP readings 120-140 mostly 130s Lab showed mostly normal creatinine, slightly increased but overall not a concern Current Meds - Valsartan  80mg  daily Reports good compliance, took meds today. Tolerating well, w/o complaints - Taking ASA 81mg  daily tolerating well   Hypothyroidism, chronic Last lab normal TSH Stable, controlled Continues Levothyroxine  50mcg daily She denies any symptoms of low thyroid hormone at this time   Elevated A1c Last A1c 5.7, slightly elevated 5.4 to 5.6 No new concerns or updates. Meds: Never on medication for blood sugar Currently on ARB Lifestyle: - Weight is stable Improving healthy diet, limit sweets, improve appetite, mostly water - Exercise (Regular exercises mostly outdoor and at home) - Family history of DM2 (Father) Denies hypoglycemia   Mild Elevated LDL PAD, mild Venous Insufficiency    Health Maintenance: Vaccines updated     07/04/2024   11:34 AM 04/22/2024   10:50 AM 05/09/2023    2:58 PM  Depression screen PHQ 2/9  Decreased Interest 0 0 0  Down, Depressed, Hopeless 0 0 0  PHQ - 2 Score 0 0 0  Altered sleeping  0 0 0  Tired, decreased energy 0  0  Change in appetite 0 0 0  Feeling bad or failure about yourself  0 0 0  Trouble concentrating 0 0 0  Moving slowly or fidgety/restless 0 0 0  Suicidal thoughts 0 0 0  PHQ-9 Score 0 0 0  Difficult doing work/chores Not difficult at all  Not difficult at all       04/22/2024   10:50 AM 05/09/2023    2:58 PM 05/03/2022    2:30 PM  GAD 7 : Generalized Anxiety Score  Nervous, Anxious, on Edge 0 0 0  Control/stop worrying 0 0 0  Worry too much - different things 0 0 0  Trouble relaxing 0 0 0  Restless 0 0 0  Easily annoyed or irritable 0 0 0  Afraid - awful might happen 0 0 0  Total GAD 7 Score 0 0 0  Anxiety Difficulty  Not difficult at all Not difficult at all    Social History   Tobacco Use   Smoking status: Never   Smokeless tobacco: Never  Vaping Use   Vaping status: Never Used  Substance Use Topics   Alcohol use: No    Alcohol/week: 0.0 standard drinks of alcohol   Drug use: No    Review of Systems Per HPI unless specifically indicated above     Objective:    BP (!) 140/86 (BP Location: Left Arm, Cuff Size: Normal)   Pulse 70   Ht 5' 6 (1.676 m)   Wt 127 lb (57.6 kg)  SpO2 98%   BMI 20.50 kg/m   Wt Readings from Last 3 Encounters:  08/28/24 127 lb (57.6 kg)  04/22/24 129 lb 2 oz (58.6 kg)  08/14/23 125 lb (56.7 kg)    Physical Exam Vitals and nursing note reviewed.  Constitutional:      General: She is not in acute distress.    Appearance: Normal appearance. She is well-developed. She is not diaphoretic.     Comments: Well-appearing, comfortable, cooperative  HENT:     Head: Normocephalic and atraumatic.  Eyes:     General:        Right eye: No discharge.        Left eye: No discharge.     Conjunctiva/sclera: Conjunctivae normal.  Neck:     Thyroid: No thyromegaly.  Cardiovascular:     Rate and Rhythm: Normal rate and regular rhythm.     Heart sounds: Normal heart sounds. No murmur heard. Pulmonary:      Effort: Pulmonary effort is normal. No respiratory distress.     Breath sounds: Normal breath sounds. No wheezing or rales.  Musculoskeletal:        General: Normal range of motion.     Cervical back: Normal range of motion and neck supple.  Lymphadenopathy:     Cervical: No cervical adenopathy.  Skin:    General: Skin is warm and dry.     Findings: No erythema or rash.  Neurological:     Mental Status: She is alert and oriented to person, place, and time.  Psychiatric:        Mood and Affect: Mood normal.        Behavior: Behavior normal.        Thought Content: Thought content normal.     Comments: Well groomed, good eye contact, normal speech and thoughts     Results for orders placed or performed in visit on 04/22/24  POCT Urinalysis Dipstick   Collection Time: 04/22/24 10:58 AM  Result Value Ref Range   Color, UA yellow    Clarity, UA cloudy    Glucose, UA Negative Negative   Bilirubin, UA negative    Ketones, UA negative    Spec Grav, UA 1.015 1.010 - 1.025   Blood, UA small    pH, UA 5.0 5.0 - 8.0   Protein, UA Negative Negative   Urobilinogen, UA 0.2 0.2 or 1.0 E.U./dL   Nitrite, UA negative    Leukocytes, UA Negative Negative   Appearance     Odor positive   Urine Culture   Collection Time: 04/22/24 11:07 AM   Specimen: Urine  Result Value Ref Range   MICRO NUMBER: [id number]    SPECIMEN QUALITY: Adequate    Sample Source URINE    STATUS: FINAL    Result: No Growth       Assessment & Plan:   Problem List Items Addressed This Visit     Hypothyroidism (Chronic)   Elevated hemoglobin A1c   Essential hypertension - Primary      Hypertension Mild elevated systolic, improved on recheck, historically elevated BP in office compared to home readings Continue Valsartan  80mg  daily  Vitamin B12 and Vitamin D Monitoring Discussed vitamin B12 and vitamin D levels. Current multivitamin may be insufficient. Intramuscular B12 injections may be needed if  deficiency confirmed. - Check vitamin B12 and vitamin D levels during next physical exam. - Continue current multivitamin supplementation. - Consider B12 injections if deficiency confirmed.  Hypothyroidism Controlled on Levothyroxine  50mcg daily Check  labs at next visit  General Health Maintenance Vaccinations for pneumonia, flu, and shingles completed. No additional screenings or vaccinations needed. - Schedule next physical exam in May with full blood panel.        No orders of the defined types were placed in this encounter.   No orders of the defined types were placed in this encounter.   Follow up plan: Return in about 7 months (around 03/28/2025) for 7 month fasting lab > 1 week later Annual Physical.  Future labs ordered for 03/30/25  Including B12, Vitamin D   Marsa Officer, DO [facility] 08/28/2024, 11:42 AM

## 2024-08-28 NOTE — Patient Instructions (Signed)
 Thank you for coming to the office today.    Please schedule a Follow-up Appointment to: No follow-ups on file.  If you have any other questions or concerns, please feel free to call the office or send a message through MyChart. You may also schedule an earlier appointment if necessary.  Additionally, you may be receiving a survey about your experience at our office within a few days to 1 week by e-mail or mail. We value your feedback.  Marsa Officer, DO [facility], [place]

## 2024-10-28 ENCOUNTER — Telehealth: Payer: Self-pay

## 2024-10-28 NOTE — Telephone Encounter (Signed)
 Copied from CRM #[id number]. Topic: General - Call Back - No Documentation >> Oct 28, 2024 11:28 AM Antony RAMAN wrote: Reason for CRM: patient returning call no notes

## 2024-10-28 NOTE — Telephone Encounter (Signed)
 Spoke with patient, she needed to schedule her AWV. That was scheduled

## 2025-03-30 ENCOUNTER — Other Ambulatory Visit

## 2025-04-06 ENCOUNTER — Encounter: Admitting: Family Medicine

## 2025-07-17 ENCOUNTER — Ambulatory Visit

## 2025-07-22 ENCOUNTER — Ambulatory Visit
